# Patient Record
Sex: Male | Born: 1950 | Race: White | Hispanic: No | Smoking: Current every day smoker
Health system: Southern US, Community
[De-identification: ages and names within clinical notes are randomized; demographics above are authoritative.]

## PROBLEM LIST (undated history)

## (undated) DIAGNOSIS — I10 Essential (primary) hypertension: Secondary | ICD-10-CM

## (undated) DIAGNOSIS — I2510 Atherosclerotic heart disease of native coronary artery without angina pectoris: Secondary | ICD-10-CM

## (undated) HISTORY — PX: CORONARY ANGIOPLASTY WITH STENT PLACEMENT: SHX49

## (undated) HISTORY — PX: OTHER SURGICAL HISTORY: SHX169

---

## 2004-03-11 ENCOUNTER — Other Ambulatory Visit: Payer: Self-pay

## 2004-03-11 ENCOUNTER — Emergency Department: Payer: Self-pay | Admitting: General Practice

## 2004-03-12 ENCOUNTER — Encounter: Payer: Self-pay | Admitting: Internal Medicine

## 2004-04-22 ENCOUNTER — Ambulatory Visit: Payer: Self-pay | Admitting: Internal Medicine

## 2004-05-15 ENCOUNTER — Ambulatory Visit: Payer: Self-pay | Admitting: Internal Medicine

## 2004-07-18 ENCOUNTER — Ambulatory Visit: Payer: Self-pay | Admitting: Internal Medicine

## 2004-12-18 ENCOUNTER — Ambulatory Visit: Payer: Self-pay | Admitting: Internal Medicine

## 2005-05-24 ENCOUNTER — Ambulatory Visit: Payer: Self-pay | Admitting: Internal Medicine

## 2005-12-23 ENCOUNTER — Encounter: Payer: Self-pay | Admitting: Internal Medicine

## 2005-12-30 ENCOUNTER — Ambulatory Visit: Payer: Self-pay | Admitting: Internal Medicine

## 2006-04-29 ENCOUNTER — Ambulatory Visit: Payer: Self-pay | Admitting: Internal Medicine

## 2006-05-20 ENCOUNTER — Ambulatory Visit: Payer: Self-pay | Admitting: *Deleted

## 2006-07-21 ENCOUNTER — Ambulatory Visit: Payer: Self-pay | Admitting: Internal Medicine

## 2006-07-21 LAB — CONVERTED CEMR LAB
AST: 25 units/L (ref 0–37)
Bilirubin, Direct: 0.1 mg/dL (ref 0.0–0.3)
Chloride: 108 meq/L (ref 96–112)
Eosinophils Absolute: 0.1 10*3/uL (ref 0.0–0.6)
Eosinophils Relative: 1.7 % (ref 0.0–5.0)
GFR calc non Af Amer: 94 mL/min
Glucose, Bld: 93 mg/dL (ref 70–99)
HCT: 39.1 % (ref 39.0–52.0)
Hemoglobin: 13.7 g/dL (ref 13.0–17.0)
Lymphocytes Relative: 23.2 % (ref 12.0–46.0)
MCV: 90.9 fL (ref 78.0–100.0)
Monocytes Absolute: 0.6 10*3/uL (ref 0.2–0.7)
Neutrophils Relative %: 67.5 % (ref 43.0–77.0)
Potassium: 4.2 meq/L (ref 3.5–5.1)
RBC: 4.3 M/uL (ref 4.22–5.81)
Sodium: 143 meq/L (ref 135–145)
TSH: 0.28 microintl units/mL — ABNORMAL LOW (ref 0.35–5.50)
Total Bilirubin: 0.7 mg/dL (ref 0.3–1.2)
WBC: 8 10*3/uL (ref 4.5–10.5)

## 2006-07-24 ENCOUNTER — Ambulatory Visit: Payer: Self-pay | Admitting: *Deleted

## 2006-08-16 ENCOUNTER — Emergency Department: Payer: Self-pay | Admitting: Emergency Medicine

## 2006-08-16 ENCOUNTER — Other Ambulatory Visit: Payer: Self-pay

## 2006-10-14 ENCOUNTER — Ambulatory Visit: Payer: Self-pay | Admitting: Internal Medicine

## 2006-10-14 DIAGNOSIS — E785 Hyperlipidemia, unspecified: Secondary | ICD-10-CM | POA: Insufficient documentation

## 2006-10-14 DIAGNOSIS — F603 Borderline personality disorder: Secondary | ICD-10-CM | POA: Insufficient documentation

## 2006-10-15 LAB — CONVERTED CEMR LAB
Cholesterol: 144 mg/dL (ref 0–200)
Free T4: 0.7 ng/dL (ref 0.6–1.6)
LDL Cholesterol: 88 mg/dL (ref 0–99)
TSH: 0.36 microintl units/mL (ref 0.35–5.50)
VLDL: 16 mg/dL (ref 0–40)

## 2006-11-04 ENCOUNTER — Telehealth: Payer: Self-pay | Admitting: Internal Medicine

## 2007-01-28 DIAGNOSIS — E039 Hypothyroidism, unspecified: Secondary | ICD-10-CM | POA: Insufficient documentation

## 2007-01-28 DIAGNOSIS — I2510 Atherosclerotic heart disease of native coronary artery without angina pectoris: Secondary | ICD-10-CM

## 2007-02-01 ENCOUNTER — Telehealth: Payer: Self-pay | Admitting: Internal Medicine

## 2007-02-09 ENCOUNTER — Encounter (INDEPENDENT_AMBULATORY_CARE_PROVIDER_SITE_OTHER): Payer: Self-pay | Admitting: *Deleted

## 2007-11-04 ENCOUNTER — Ambulatory Visit: Payer: Self-pay | Admitting: Internal Medicine

## 2007-11-04 DIAGNOSIS — F438 Other reactions to severe stress: Secondary | ICD-10-CM

## 2007-11-04 DIAGNOSIS — R634 Abnormal weight loss: Secondary | ICD-10-CM

## 2007-11-08 LAB — CONVERTED CEMR LAB
ALT: 25 units/L (ref 0–53)
AST: 26 units/L (ref 0–37)
BUN: 13 mg/dL (ref 6–23)
Bilirubin, Direct: 0.1 mg/dL (ref 0.0–0.3)
Calcium: 9.1 mg/dL (ref 8.4–10.5)
Glucose, Bld: 84 mg/dL (ref 70–99)
Lymphocytes Relative: 20.3 % (ref 12.0–46.0)
Monocytes Relative: 3.4 % (ref 3.0–12.0)
Phosphorus: 3.8 mg/dL (ref 2.3–4.6)
Platelets: 218 10*3/uL (ref 150–400)
Potassium: 4.6 meq/L (ref 3.5–5.1)
RDW: 12.9 % (ref 11.5–14.6)
TSH: 0.37 microintl units/mL (ref 0.35–5.50)
Total Bilirubin: 0.5 mg/dL (ref 0.3–1.2)

## 2008-08-29 ENCOUNTER — Encounter: Payer: Self-pay | Admitting: Internal Medicine

## 2010-04-01 ENCOUNTER — Emergency Department: Payer: Self-pay | Admitting: Emergency Medicine

## 2010-11-27 ENCOUNTER — Emergency Department (HOSPITAL_COMMUNITY): Payer: No Typology Code available for payment source

## 2010-11-27 ENCOUNTER — Emergency Department (HOSPITAL_COMMUNITY)
Admission: EM | Admit: 2010-11-27 | Discharge: 2010-11-27 | Disposition: A | Discharge status: Home / Self Care | Payer: No Typology Code available for payment source | Attending: Emergency Medicine | Admitting: Emergency Medicine

## 2010-11-27 DIAGNOSIS — S20229A Contusion of unspecified back wall of thorax, initial encounter: Secondary | ICD-10-CM | POA: Insufficient documentation

## 2010-11-27 DIAGNOSIS — IMO0002 Reserved for concepts with insufficient information to code with codable children: Secondary | ICD-10-CM | POA: Insufficient documentation

## 2010-11-27 DIAGNOSIS — Z79899 Other long term (current) drug therapy: Secondary | ICD-10-CM | POA: Insufficient documentation

## 2010-11-27 DIAGNOSIS — Z8674 Personal history of sudden cardiac arrest: Secondary | ICD-10-CM | POA: Insufficient documentation

## 2010-11-27 DIAGNOSIS — M533 Sacrococcygeal disorders, not elsewhere classified: Secondary | ICD-10-CM | POA: Insufficient documentation

## 2010-11-27 DIAGNOSIS — I252 Old myocardial infarction: Secondary | ICD-10-CM | POA: Insufficient documentation

## 2010-11-27 DIAGNOSIS — E7800 Pure hypercholesterolemia, unspecified: Secondary | ICD-10-CM | POA: Insufficient documentation

## 2010-11-27 DIAGNOSIS — M79609 Pain in unspecified limb: Secondary | ICD-10-CM | POA: Insufficient documentation

## 2010-11-27 DIAGNOSIS — M542 Cervicalgia: Secondary | ICD-10-CM | POA: Insufficient documentation

## 2010-11-27 DIAGNOSIS — S6000XA Contusion of unspecified finger without damage to nail, initial encounter: Secondary | ICD-10-CM | POA: Insufficient documentation

## 2010-11-27 DIAGNOSIS — S139XXA Sprain of joints and ligaments of unspecified parts of neck, initial encounter: Secondary | ICD-10-CM | POA: Insufficient documentation

## 2012-03-08 DIAGNOSIS — I2510 Atherosclerotic heart disease of native coronary artery without angina pectoris: Secondary | ICD-10-CM | POA: Insufficient documentation

## 2013-05-23 ENCOUNTER — Ambulatory Visit: Payer: PRIVATE HEALTH INSURANCE | Admitting: Internal Medicine

## 2013-05-23 DIAGNOSIS — Z0289 Encounter for other administrative examinations: Secondary | ICD-10-CM

## 2013-06-12 ENCOUNTER — Emergency Department (HOSPITAL_COMMUNITY): Payer: PRIVATE HEALTH INSURANCE

## 2013-06-12 ENCOUNTER — Encounter (HOSPITAL_COMMUNITY): Payer: Self-pay | Admitting: Emergency Medicine

## 2013-06-12 ENCOUNTER — Other Ambulatory Visit: Payer: Self-pay

## 2013-06-12 ENCOUNTER — Emergency Department (HOSPITAL_COMMUNITY)
Admission: EM | Admit: 2013-06-12 | Discharge: 2013-06-12 | Disposition: A | Discharge status: Home / Self Care | Payer: PRIVATE HEALTH INSURANCE | Attending: Emergency Medicine | Admitting: Emergency Medicine

## 2013-06-12 DIAGNOSIS — M545 Low back pain, unspecified: Secondary | ICD-10-CM | POA: Insufficient documentation

## 2013-06-12 DIAGNOSIS — R079 Chest pain, unspecified: Secondary | ICD-10-CM

## 2013-06-12 DIAGNOSIS — I10 Essential (primary) hypertension: Secondary | ICD-10-CM | POA: Insufficient documentation

## 2013-06-12 DIAGNOSIS — M549 Dorsalgia, unspecified: Secondary | ICD-10-CM

## 2013-06-12 DIAGNOSIS — I2510 Atherosclerotic heart disease of native coronary artery without angina pectoris: Secondary | ICD-10-CM | POA: Insufficient documentation

## 2013-06-12 DIAGNOSIS — F17200 Nicotine dependence, unspecified, uncomplicated: Secondary | ICD-10-CM | POA: Insufficient documentation

## 2013-06-12 DIAGNOSIS — R0789 Other chest pain: Secondary | ICD-10-CM | POA: Insufficient documentation

## 2013-06-12 HISTORY — DX: Atherosclerotic heart disease of native coronary artery without angina pectoris: I25.10

## 2013-06-12 HISTORY — DX: Essential (primary) hypertension: I10

## 2013-06-12 LAB — CBC WITH DIFFERENTIAL/PLATELET
BASOS ABS: 0 10*3/uL (ref 0.0–0.1)
BASOS PCT: 0 % (ref 0–1)
EOS ABS: 0.2 10*3/uL (ref 0.0–0.7)
Eosinophils Relative: 2 % (ref 0–5)
HCT: 42.3 % (ref 39.0–52.0)
HEMOGLOBIN: 14.5 g/dL (ref 13.0–17.0)
Lymphocytes Relative: 24 % (ref 12–46)
Lymphs Abs: 1.8 10*3/uL (ref 0.7–4.0)
MCH: 31.4 pg (ref 26.0–34.0)
MCHC: 34.3 g/dL (ref 30.0–36.0)
MCV: 91.6 fL (ref 78.0–100.0)
MONO ABS: 0.7 10*3/uL (ref 0.1–1.0)
MONOS PCT: 9 % (ref 3–12)
NEUTROS ABS: 4.8 10*3/uL (ref 1.7–7.7)
NEUTROS PCT: 64 % (ref 43–77)
Platelets: 176 10*3/uL (ref 150–400)
RBC: 4.62 MIL/uL (ref 4.22–5.81)
RDW: 12.2 % (ref 11.5–15.5)
WBC: 7.4 10*3/uL (ref 4.0–10.5)

## 2013-06-12 LAB — BASIC METABOLIC PANEL
BUN: 16 mg/dL (ref 6–23)
CO2: 29 mEq/L (ref 19–32)
CREATININE: 0.95 mg/dL (ref 0.50–1.35)
Calcium: 9.2 mg/dL (ref 8.4–10.5)
Chloride: 105 mEq/L (ref 96–112)
GFR, EST NON AFRICAN AMERICAN: 89 mL/min — AB (ref >90)
Glucose, Bld: 99 mg/dL (ref 70–99)
POTASSIUM: 4.5 meq/L (ref 3.7–5.3)
Sodium: 142 mEq/L (ref 137–147)

## 2013-06-12 LAB — TROPONIN I: Troponin I: <0.3 ng/mL (ref <0.30)

## 2013-06-12 LAB — URINALYSIS, ROUTINE W REFLEX MICROSCOPIC
Bilirubin Urine: NEGATIVE
GLUCOSE, UA: NEGATIVE mg/dL
KETONES UR: NEGATIVE mg/dL
Leukocytes, UA: NEGATIVE
Nitrite: NEGATIVE
PROTEIN: NEGATIVE mg/dL
Specific Gravity, Urine: >1.03 — ABNORMAL HIGH (ref 1.005–1.030)
UROBILINOGEN UA: 0.2 mg/dL (ref 0.0–1.0)
pH: 6 (ref 5.0–8.0)

## 2013-06-12 LAB — URINE MICROSCOPIC-ADD ON

## 2013-06-12 LAB — PRO B NATRIURETIC PEPTIDE: PRO B NATRI PEPTIDE: 22.7 pg/mL (ref 0–125)

## 2013-06-12 MED ORDER — HYDROCODONE-ACETAMINOPHEN 5-325 MG PO TABS: 2.0000 | ORAL_TABLET | ORAL | Status: DC | PRN

## 2013-06-12 NOTE — ED Provider Notes (Signed)
CSN: [id number]     Arrival date & time 06/12/13  1008 History  This chart was scribed for Gilda Crease, MD by Bennett Scrape, ED Scribe. This patient was seen in room APA05/APA05 and the patient's care was started at 10:24 AM.   Chief Complaint  Patient presents with  . Chest Pain    The history is provided by the patient. No language interpreter was used.    HPI Comments: Louis Carr is a 63 y.o. male who presents to the Emergency Department in the [place] of [facility] Department complaining of left lower back pain that started one month. He describes the pain as dull and aching. He states that most of the time the pain is constant but occasionally it will be intermittent. He denies any changes in BMs or urinary symptoms.   He has a secondary complaint of upper left-sided CP that has been going on for one month or longer. The pain is intermittent and brief, lasting a few seconds at a time. He describes this pain as more sharp. He denies that the pain is triggered by exertion. He denies having any prolonged episodes of pain. He denies any cough or fevers. He has a h/o cardiac stent placement in 2005 done at [facility].   Past Medical History  Diagnosis Date  . Coronary artery disease   . Hypertension    Past Surgical History  Procedure Laterality Date  . Stint     No family history on file. History  Substance Use Topics  . Smoking status: Current Every Day Smoker  . Smokeless tobacco: Not on file  . Alcohol Use: Yes    Review of Systems  Cardiovascular: Positive for chest pain.  Gastrointestinal: Negative for nausea, vomiting and diarrhea.  Genitourinary: Negative for hematuria and difficulty urinating.  Musculoskeletal: Positive for back pain.  All other systems reviewed and are negative.    Allergies  Review of patient's allergies indicates no known allergies.  Home Medications  No current outpatient prescriptions on file.  Triage Vitals: BP  109/75  Pulse 50  Temp(Src) 98.3 F (36.8 C) (Oral)  Resp 12  Ht 5\' 11"  (1.803 m)  Wt 160 lb (72.576 kg)  BMI 22.33 kg/m2  SpO2 100%  Physical Exam  Nursing note and vitals reviewed. Constitutional: He is oriented to person, place, and time. He appears well-developed and well-nourished. No distress.  HENT:  Head: Normocephalic and atraumatic.  Right Ear: Hearing normal.  Left Ear: Hearing normal.  Nose: Nose normal.  Mouth/Throat: Oropharynx is clear and moist and mucous membranes are normal.  Eyes: Conjunctivae and EOM are normal. Pupils are equal, round, and reactive to light.  Neck: Normal range of motion. Neck supple.  Cardiovascular: Normal rate, regular rhythm, S1 normal and S2 normal.  Exam reveals no gallop and no friction rub.   No murmur heard. Pulmonary/Chest: Effort normal and breath sounds normal. No respiratory distress. He exhibits no tenderness.  Abdominal: Soft. Normal appearance and bowel sounds are normal. There is no hepatosplenomegaly. There is no tenderness. There is no rebound, no guarding, no tenderness at McBurney's point and negative Murphy's sign. No hernia.  Musculoskeletal: Normal range of motion.  Neurological: He is alert and oriented to person, place, and time. He has normal strength. No cranial nerve deficit or sensory deficit. Coordination normal. GCS eye subscore is 4. GCS verbal subscore is 5. GCS motor subscore is 6.  Skin: Skin is warm, dry and intact. No rash noted. No cyanosis.  Psychiatric:  He has a normal mood and affect. His speech is normal and behavior is normal. Thought content normal.    ED Course  Procedures (including critical care time)  DIAGNOSTIC STUDIES: Oxygen Saturation is 100% on RA, normal by my interpretation.    COORDINATION OF CARE: 10:23 AM-Discussed treatment plan which includes  (CXR, CBC panel, CMP, UA) with pt at bedside and pt agreed to plan.   Labs Review Labs Reviewed - No data to display Imaging Review No  results found.  EKG Interpretation   None       Date: 06/12/2013  Rate: 49  Rhythm: sinus tachycardia and sinus bradycardia  QRS Axis: normal  Intervals: normal  ST/T Wave abnormalities: early repolarization  Conduction Disutrbances:none  Narrative Interpretation:   Old EKG Reviewed: none available    MDM   1. Chest pain   2. Back pain    ER for evaluation of 2 separate complaints. Patient has been having intermittent left-sided chest pain for at least a month. Pain is sharp and only lasts for seconds. He is not identified anything that brings it on, it is not related to exertion. He does have a cardiac history, having stents placed at [facility]. At this point, this does not appear to be related to cardiac history. His EKG does not show any obvious acute ischemia or infarct. His troponin was negative. Patient's symptoms have been going on for more than a month and are very atypical. He is to be referred back to his [facility]ologist at [facility] for repeat evaluation.  Also complaining of pain in the left lower back region. He has no neurologic findings on examination.  A chest x-ray was obtained today and he does have compression fractures at T8 and T9. These compression fractures might explain his pain in its entirety, including the chest and the back. They appear chronic and do not require any intervention at this time. The patient be treated with analgesia, referred back to his primary care doctor for further monitoring.  I personally performed the services described in this documentation, which was scribed in my presence. The recorded information has been reviewed and is accurate.     Gilda Creasehristopher J. Pollina, MD 06/12/13 [phone]

## 2013-06-12 NOTE — ED Notes (Signed)
Complain of back and chest pain for a while.

## 2013-06-12 NOTE — Discharge Instructions (Signed)
Back Pain, Adult  Low back pain is very common. About 1 in 5 people have back pain.The cause of low back pain is rarely dangerous. The pain often gets better over time.About half of people with a sudden onset of back pain feel better in just 2 weeks. About 8 in 10 people feel better by 6 weeks.   CAUSES  Some common causes of back pain include:   Strain of the muscles or ligaments supporting the spine.   Wear and tear (degeneration) of the spinal discs.   Arthritis.   Direct injury to the back.  DIAGNOSIS  Most of the time, the direct cause of low back pain is not known.However, back pain can be treated effectively even when the exact cause of the pain is unknown.Answering your caregiver's questions about your overall health and symptoms is one of the most accurate ways to make sure the cause of your pain is not dangerous. If your caregiver needs more information, he or she may order lab work or imaging tests (X-rays or MRIs).However, even if imaging tests show changes in your back, this usually does not require surgery.  HOME CARE INSTRUCTIONS  For many people, back pain returns.Since low back pain is rarely dangerous, it is often a condition that people can learn to manageon their own.    Remain active. It is stressful on the back to sit or stand in one place. Do not sit, drive, or stand in one place for more than 30 minutes at a time. Take short walks on level surfaces as soon as pain allows.Try to increase the length of time you walk each day.   Do not stay in bed.Resting more than 1 or 2 days can delay your recovery.   Do not avoid exercise or work.Your body is made to move.It is not dangerous to be active, even though your back may hurt.Your back will likely heal faster if you return to being active before your pain is gone.   Pay attention to your body when you bend and lift. Many people have less discomfortwhen lifting if they bend their knees, keep the load close to their bodies,and  avoid twisting. Often, the most comfortable positions are those that put less stress on your recovering back.   Find a comfortable position to sleep. Use a firm mattress and lie on your side with your knees slightly bent. If you lie on your back, put a pillow under your knees.   Only take over-the-counter or prescription medicines as directed by your caregiver. Over-the-counter medicines to reduce pain and inflammation are often the most helpful.Your caregiver may prescribe muscle relaxant drugs.These medicines help dull your pain so you can more quickly return to your normal activities and healthy exercise.   Put ice on the injured area.   Put ice in a plastic bag.   Place a towel between your skin and the bag.   Leave the ice on for 15-20 minutes, 03-04 times a day for the first 2 to 3 days. After that, ice and heat may be alternated to reduce pain and spasms.   Ask your caregiver about trying back exercises and gentle massage. This may be of some benefit.   Avoid feeling anxious or stressed.Stress increases muscle tension and can worsen back pain.It is important to recognize when you are anxious or stressed and learn ways to manage it.Exercise is a great option.  SEEK MEDICAL CARE IF:   You have pain that is not relieved with rest or   medicine.   You have pain that does not improve in 1 week.   You have new symptoms.   You are generally not feeling well.  SEEK IMMEDIATE MEDICAL CARE IF:    You have pain that radiates from your back into your legs.   You develop new bowel or bladder control problems.   You have unusual weakness or numbness in your arms or legs.   You develop nausea or vomiting.   You develop abdominal pain.   You feel faint.  Document Released: 05/26/2005 Document Revised: 11/25/2011 Document Reviewed: 10/14/2010  ExitCare Patient Information 2014 [employer]  Chest Pain (Nonspecific)  It is often hard to give a specific diagnosis for the cause of chest pain. There is  always a chance that your pain could be related to something serious, such as a heart attack or a blood clot in the lungs. You need to follow up with your caregiver for further evaluation.  CAUSES    Heartburn.   Pneumonia or bronchitis.   Anxiety or stress.   Inflammation around your heart (pericarditis) or lung (pleuritis or pleurisy).   A blood clot in the lung.   A collapsed lung (pneumothorax). It can develop suddenly on its own (spontaneous pneumothorax) or from injury (trauma) to the chest.   Shingles infection (herpes zoster virus).  The chest wall is composed of bones, muscles, and cartilage. Any of these can be the source of the pain.   The bones can be bruised by injury.   The muscles or cartilage can be strained by coughing or overwork.   The cartilage can be affected by inflammation and become sore (costochondritis).  DIAGNOSIS   Lab tests or other studies, such as X-rays, electrocardiography, stress testing, or cardiac imaging, may be needed to find the cause of your pain.   TREATMENT    Treatment depends on what may be causing your chest pain. Treatment may include:   Acid blockers for heartburn.   Anti-inflammatory medicine.   Pain medicine for inflammatory conditions.   Antibiotics if an infection is present.   You may be advised to change lifestyle habits. This includes stopping smoking and avoiding alcohol, caffeine, and chocolate.   You may be advised to keep your head raised (elevated) when sleeping. This reduces the chance of acid going backward from your stomach into your esophagus.   Most of the time, nonspecific chest pain will improve within 2 to 3 days with rest and mild pain medicine.  HOME CARE INSTRUCTIONS    If antibiotics were prescribed, take your antibiotics as directed. Finish them even if you start to feel better.   For the next few days, avoid physical activities that bring on chest pain. Continue physical activities as directed.   Do not smoke.   Avoid  drinking alcohol.   Only take over-the-counter or prescription medicine for pain, discomfort, or fever as directed by your caregiver.   Follow your caregiver's suggestions for further testing if your chest pain does not go away.   Keep any follow-up appointments you made. If you do not go to an appointment, you could develop lasting (chronic) problems with pain. If there is any problem keeping an appointment, you must call to reschedule.  SEEK MEDICAL CARE IF:    You think you are having problems from the medicine you are taking. Read your medicine instructions carefully.   Your chest pain does not go away, even after treatment.   You develop a rash with blisters on   your chest.  SEEK IMMEDIATE MEDICAL CARE IF:    You have increased chest pain or pain that spreads to your arm, neck, jaw, back, or abdomen.   You develop shortness of breath, an increasing cough, or you are coughing up blood.   You have severe back or abdominal pain, feel nauseous, or vomit.   You develop severe weakness, fainting, or chills.   You have a fever.  THIS IS AN EMERGENCY. Do not wait to see if the pain will go away. Get medical help at once. Call your local emergency services (911 in [place] Do not drive yourself to the hospital.  MAKE SURE YOU:    Understand these instructions.   Will watch your condition.   Will get help right away if you are not doing well or get worse.  Document Released: 03/05/2005 Document Revised: 08/18/2011 Document Reviewed: 12/30/2007  ExitCare Patient Information 2014 [employer].

## 2013-06-22 ENCOUNTER — Ambulatory Visit: Payer: PRIVATE HEALTH INSURANCE | Admitting: Internal Medicine

## 2018-06-21 ENCOUNTER — Other Ambulatory Visit: Payer: Self-pay

## 2018-06-21 ENCOUNTER — Emergency Department
Admission: EM | Admit: 2018-06-21 | Discharge: 2018-06-21 | Disposition: A | Discharge status: Home / Self Care | Payer: Worker's Compensation | Attending: Emergency Medicine | Admitting: Emergency Medicine

## 2018-06-21 ENCOUNTER — Emergency Department: Payer: Worker's Compensation

## 2018-06-21 DIAGNOSIS — S20211A Contusion of right front wall of thorax, initial encounter: Secondary | ICD-10-CM | POA: Insufficient documentation

## 2018-06-21 DIAGNOSIS — I2510 Atherosclerotic heart disease of native coronary artery without angina pectoris: Secondary | ICD-10-CM | POA: Insufficient documentation

## 2018-06-21 DIAGNOSIS — W231XXA Caught, crushed, jammed, or pinched between stationary objects, initial encounter: Secondary | ICD-10-CM | POA: Diagnosis not present

## 2018-06-21 DIAGNOSIS — Y999 Unspecified external cause status: Secondary | ICD-10-CM | POA: Insufficient documentation

## 2018-06-21 DIAGNOSIS — F17200 Nicotine dependence, unspecified, uncomplicated: Secondary | ICD-10-CM | POA: Diagnosis not present

## 2018-06-21 DIAGNOSIS — Z7982 Long term (current) use of aspirin: Secondary | ICD-10-CM | POA: Insufficient documentation

## 2018-06-21 DIAGNOSIS — Y939 Activity, unspecified: Secondary | ICD-10-CM | POA: Insufficient documentation

## 2018-06-21 DIAGNOSIS — I10 Essential (primary) hypertension: Secondary | ICD-10-CM | POA: Insufficient documentation

## 2018-06-21 DIAGNOSIS — Y990 Civilian activity done for income or pay: Secondary | ICD-10-CM | POA: Insufficient documentation

## 2018-06-21 DIAGNOSIS — S299XXA Unspecified injury of thorax, initial encounter: Secondary | ICD-10-CM | POA: Diagnosis present

## 2018-06-21 DIAGNOSIS — Z7902 Long term (current) use of antithrombotics/antiplatelets: Secondary | ICD-10-CM | POA: Insufficient documentation

## 2018-06-21 DIAGNOSIS — E039 Hypothyroidism, unspecified: Secondary | ICD-10-CM | POA: Insufficient documentation

## 2018-06-21 DIAGNOSIS — Y929 Unspecified place or not applicable: Secondary | ICD-10-CM | POA: Insufficient documentation

## 2018-06-21 DIAGNOSIS — Z79899 Other long term (current) drug therapy: Secondary | ICD-10-CM | POA: Insufficient documentation

## 2018-06-21 MED ORDER — OXYCODONE-ACETAMINOPHEN 7.5-325 MG PO TABS: 1.0000 | ORAL_TABLET | Freq: Four times a day (QID) | ORAL | 0 refills | Status: DC | PRN

## 2018-06-21 MED ORDER — CYCLOBENZAPRINE HCL 10 MG PO TABS: 10.0000 mg | ORAL_TABLET | Freq: Three times a day (TID) | ORAL | 0 refills | Status: DC | PRN

## 2018-06-21 MED ORDER — IBUPROFEN 600 MG PO TABS: 600.0000 mg | ORAL_TABLET | Freq: Three times a day (TID) | ORAL | 0 refills | Status: DC | PRN

## 2018-06-21 MED ORDER — KETOROLAC TROMETHAMINE 60 MG/2ML IM SOLN
30.0000 mg | Freq: Once | INTRAMUSCULAR | Status: AC
  Administered 2018-06-21: 30 mg via INTRAMUSCULAR
  Filled 2018-06-21: qty 2

## 2018-06-21 NOTE — ED Provider Notes (Signed)
[facility] Emergency Department Provider Note   ____________________________________________   First MD Initiated Contact with Patient 06/21/18 1036     (approximate)  I have reviewed the triage vital signs and the nursing notes.   HISTORY  Chief Complaint Shortness of Breath and Trauma    HPI Louis Carr is a 68 y.o. male patient complain of right lateral chest wall pain secondary to contusion.  Patient state he was pinned between a door and steel grinder at work.  3 days ago.  Patient states the pain has increased from his soreness to acute pain with deep inspirations.  Patient also state pain increases with abduction of the right upper extremity.  Past Medical History:  Diagnosis Date  . Coronary artery disease   . Hypertension     Patient Active Problem List   Diagnosis Date Noted  . ANXIETY, SITUATIONAL 11/04/2007  . WEIGHT LOSS, ABNORMAL 11/04/2007  . HYPOTHYROIDISM 01/28/2007  . CORONARY ARTERY DISEASE 01/28/2007  . HYPERLIPIDEMIA 10/14/2006  . 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 stint      Prior to Admission medications   Medication Sig Start Date End Date Taking? Authorizing Provider  aspirin EC 81 MG tablet Take 81 mg by mouth daily.    [provider]  clopidogrel (PLAVIX) 75 MG tablet Take 75 mg by mouth daily with breakfast.    [provider]  cyclobenzaprine (FLEXERIL) 10 MG tablet Take 1 tablet (10 mg total) by mouth 3 (three) times daily as needed. 06/21/18   Joni ReiningSmith, Avangeline Stockburger K, PA-C  HYDROcodone-acetaminophen (NORCO/VICODIN) 5-325 MG per tablet Take 2 tablets by mouth every 4 (four) hours as needed for moderate pain. 06/12/13   Gilda CreasePollina, Christopher J, MD  ibuprofen (ADVIL,MOTRIN) 600 MG tablet Take 1 tablet (600 mg total) by mouth every 8 (eight) hours as needed. 06/21/18   Joni ReiningSmith, Ewell Benassi K, PA-C  lisinopril (PRINIVIL,ZESTRIL) 5 MG tablet Take 5  mg by mouth daily.    [provider]  oxyCODONE-acetaminophen (PERCOCET) 7.5-325 MG tablet Take 1 tablet by mouth every 6 (six) hours as needed for severe pain. 06/21/18   Joni ReiningSmith, Camauri Craton K, PA-C    Allergies Patient has no known allergies.  No family history on file.  Social History Social History   Tobacco Use  . Smoking status: Current Every Day Smoker  . Smokeless tobacco: Never Used  Substance Use Topics  . Alcohol use: Yes  . Drug use: No    Review of Systems Constitutional: No fever/chills Eyes: No visual changes. ENT: No sore throat. Cardiovascular: Denies chest pain. Respiratory: Denies shortness of breath. Gastrointestinal: No abdominal pain.  No nausea, no vomiting.  No diarrhea.  No constipation. Musculoskeletal: Right chest wall pain. Skin: Negative for rash. Neurological: Negative for headaches, focal weakness or numbness. Psychiatric:Anxiety. Endocrine:Hyperlipidemia, hypothyroidism.  Hypertension.  ____________________________________________   PHYSICAL EXAM:  VITAL SIGNS: ED Triage Vitals  Enc Vitals Group     BP 06/21/18 0927 (!) 140/94     Pulse Rate 06/21/18 0927 69     Resp 06/21/18 0927 17     Temp 06/21/18 0927 98.3 F (36.8 C)     Temp Source 06/21/18 0927 Oral     SpO2 06/21/18 0927 98 %     Weight 06/21/18 0928 190 lb (86.2 kg)     Height 06/21/18 0928 5\' 11"  (1.803 m)     Head Circumference --      Peak  Flow --      Pain Score 06/21/18 0928 7     Pain Loc --      Pain Edu? --      Excl. in GC? --     Constitutional: Alert and oriented. Well appearing and in no acute distress. Neck: No stridor.   Hematological/Lymphatic/Immunilogical: No cervical lymphadenopathy. Cardiovascular: Normal rate, regular rhythm. Grossly normal heart sounds.  Good peripheral circulation. Respiratory: Right-sided splinting with inspiration.   No retractions. Lungs CTAB. Gastrointestinal: Soft and nontender. No distention. No abdominal bruits.  No CVA tenderness. Musculoskeletal: No obvious deformity to the left leg.  Patient has moderate guarding palpation of the left calf. Neurologic:  Normal speech and language. No gross focal neurologic deficits are appreciated. No gait instability. Skin:  Skin is warm, dry and intact. No rash noted.  No abrasion or ecchymosis. Psychiatric: Mood and affect are normal. Speech and behavior are normal.  ____________________________________________   LABS (all labs ordered are listed, but only abnormal results are displayed)  Labs Reviewed - No data to display ____________________________________________  EKG   ____________________________________________  RADIOLOGY  ED MD interpretation:    Official radiology report(s): Dg Chest 2 View  Result Date: 06/21/2018 CLINICAL DATA:  Chest pain after injury at work 3 days ago. EXAM: CHEST - 2 VIEW COMPARISON:  None available currently. FINDINGS: The heart size and mediastinal contours are within normal limits. Both lungs are clear. No pneumothorax or pleural effusion is noted. Compression deformity of 2 lower thoracic vertebral bodies is noted most consistent with old fractures. IMPRESSION: No active cardiopulmonary disease. Electronically Signed   By: Lupita Raider, M.D.   On: 06/21/2018 09:57    ____________________________________________   PROCEDURES  Procedure(s) performed: None  Procedures  Critical Care performed: No  ____________________________________________   INITIAL IMPRESSION / ASSESSMENT AND PLAN / ED COURSE  As part of my medical decision making, I reviewed the following data within the electronic MEDICAL RECORD NUMBER    Right chest wall pain secondary to contusion.  Discussed negative x-ray findings with patient.  Patient given discharge care instruction advised take medication as directed.  Patient advised return to ED if condition worsens.      ____________________________________________   FINAL CLINICAL  IMPRESSION(S) / ED DIAGNOSES  Final diagnoses:  Rib contusion, right, initial encounter     ED Discharge Orders         Ordered    oxyCODONE-acetaminophen (PERCOCET) 7.5-325 MG tablet  Every 6 hours PRN     06/21/18 1048    cyclobenzaprine (FLEXERIL) 10 MG tablet  3 times daily PRN     06/21/18 1048    ibuprofen (ADVIL,MOTRIN) 600 MG tablet  Every 8 hours PRN     06/21/18 1048           Note:  This document was prepared using Dragon voice recognition software and may include unintentional dictation errors.    Joni Reining, PA-C 06/21/18 1056    Minna Antis, MD 06/21/18 [phone]

## 2018-06-21 NOTE — ED Notes (Signed)
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  Phone No. [phone] Angie Lemmons - WC.

## 2018-06-21 NOTE — Discharge Instructions (Signed)
Follow discharge care instructions and denied take Flexeril and Percocet while working and driving.

## 2018-06-21 NOTE — ED Notes (Signed)
See triage note  Presents with some chest discomfort  States he had a machine drop on to chest on Friday   States he was having some soreness to chest   But today pain has increased

## 2018-06-21 NOTE — ED Triage Notes (Addendum)
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 States he seemed ok until today having right sided chest tenderness/pain and SOB. Denies any recent illness. Pt is in NAD, respirations WNL, skin  Is warm and dry

## 2018-07-12 ENCOUNTER — Other Ambulatory Visit: Payer: Self-pay | Admitting: Physician Assistant

## 2018-07-12 DIAGNOSIS — S32020A Wedge compression fracture of second lumbar vertebra, initial encounter for closed fracture: Secondary | ICD-10-CM

## 2018-07-12 DIAGNOSIS — S22070A Wedge compression fracture of T9-T10 vertebra, initial encounter for closed fracture: Secondary | ICD-10-CM

## 2018-07-12 DIAGNOSIS — Z77018 Contact with and (suspected) exposure to other hazardous metals: Secondary | ICD-10-CM

## 2018-07-19 ENCOUNTER — Ambulatory Visit
Admission: RE | Admit: 2018-07-19 | Discharge: 2018-07-19 | Disposition: A | Discharge status: Home / Self Care | Payer: Worker's Compensation | Source: Ambulatory Visit | Attending: Physician Assistant | Admitting: Physician Assistant

## 2018-07-19 DIAGNOSIS — S32020A Wedge compression fracture of second lumbar vertebra, initial encounter for closed fracture: Secondary | ICD-10-CM

## 2018-07-19 DIAGNOSIS — S22070A Wedge compression fracture of T9-T10 vertebra, initial encounter for closed fracture: Secondary | ICD-10-CM

## 2018-07-19 DIAGNOSIS — Z77018 Contact with and (suspected) exposure to other hazardous metals: Secondary | ICD-10-CM

## 2018-09-08 ENCOUNTER — Ambulatory Visit: Payer: Self-pay | Admitting: Internal Medicine

## 2018-10-15 ENCOUNTER — Telehealth: Payer: Self-pay

## 2018-10-15 NOTE — Telephone Encounter (Signed)
Copied from CRM [id number]. Topic: Appointment Scheduling - Scheduling Inquiry for Clinic >> Oct 14, 2018  3:28 PM Fanny Bien wrote: Reason for CRM: pt called and stated that he would like a call back about rescheduling new patient appointment. Unable to reach practice. Please advise  pt has appt scheduled. Myrella Fahs,cma

## 2018-11-03 ENCOUNTER — Ambulatory Visit: Payer: Self-pay | Admitting: Internal Medicine

## 2018-11-30 ENCOUNTER — Ambulatory Visit: Payer: PRIVATE HEALTH INSURANCE | Admitting: Internal Medicine

## 2018-12-01 ENCOUNTER — Other Ambulatory Visit: Payer: Self-pay

## 2018-12-01 ENCOUNTER — Ambulatory Visit (INDEPENDENT_AMBULATORY_CARE_PROVIDER_SITE_OTHER): Payer: Worker's Compensation | Admitting: Internal Medicine

## 2018-12-01 ENCOUNTER — Encounter: Payer: Self-pay | Admitting: Internal Medicine

## 2018-12-01 DIAGNOSIS — Z125 Encounter for screening for malignant neoplasm of prostate: Secondary | ICD-10-CM

## 2018-12-01 DIAGNOSIS — E039 Hypothyroidism, unspecified: Secondary | ICD-10-CM

## 2018-12-01 DIAGNOSIS — I2510 Atherosclerotic heart disease of native coronary artery without angina pectoris: Secondary | ICD-10-CM

## 2018-12-01 DIAGNOSIS — Z1159 Encounter for screening for other viral diseases: Secondary | ICD-10-CM

## 2018-12-01 DIAGNOSIS — E559 Vitamin D deficiency, unspecified: Secondary | ICD-10-CM

## 2018-12-01 DIAGNOSIS — S22000D Wedge compression fracture of unspecified thoracic vertebra, subsequent encounter for fracture with routine healing: Secondary | ICD-10-CM

## 2018-12-01 DIAGNOSIS — Z1389 Encounter for screening for other disorder: Secondary | ICD-10-CM

## 2018-12-01 DIAGNOSIS — Z13818 Encounter for screening for other digestive system disorders: Secondary | ICD-10-CM

## 2018-12-01 DIAGNOSIS — M79605 Pain in left leg: Secondary | ICD-10-CM | POA: Insufficient documentation

## 2018-12-01 DIAGNOSIS — Z13220 Encounter for screening for lipoid disorders: Secondary | ICD-10-CM

## 2018-12-01 DIAGNOSIS — G8929 Other chronic pain: Secondary | ICD-10-CM | POA: Insufficient documentation

## 2018-12-01 DIAGNOSIS — M79652 Pain in left thigh: Secondary | ICD-10-CM

## 2018-12-01 DIAGNOSIS — M25552 Pain in left hip: Secondary | ICD-10-CM | POA: Insufficient documentation

## 2018-12-01 NOTE — Patient Instructions (Signed)
Spinal Compression Fracture  A spinal compression fracture is a collapse of the bones that form the spine (vertebrae). With this type of fracture, the vertebrae become pushed (compressed) into a wedge shape. Most compression fractures happen in the middle or lower part of the spine. What are the causes? This condition may be caused by:  Thinning and loss of density in the bones (osteoporosis). This is the most common cause.  A fall.  A car or motorcycle accident.  Cancer.  Trauma, such as a heavy, direct hit to the head or back. What increases the risk? You are more likely to develop this condition if:  You are 62 years or older.  You have osteoporosis.  You have certain types of cancer, including: ? Multiple myeloma. ? Lymphoma. ? Prostate cancer. ? Lung cancer. ? Breast cancer. What are the signs or symptoms? Symptoms of this condition include:  Severe pain.  Pain that gets worse over time.  Pain that is worse when you stand, walk, sit, or bend.  Sudden pain that is so bad that it is hard for you to move.  Bending or humping of the spine.  Gradual loss of height.  Numbness, tingling, or weakness in the back and legs.  Trouble walking. Your symptoms will depend on the cause of the fracture and how quickly it develops. How is this diagnosed? This condition may be diagnosed based on symptoms, medical history, and a physical exam. During the physical exam, your health care provider may tap along the length of your spine to check for tenderness. Tests may be done to confirm the diagnosis. They may include:  A bone mineral density test to check for osteoporosis.  Imaging tests, such as a spine X-ray, CT scan, or MRI. How is this treated? Treatment for this condition depends on the cause and severity of the condition. Some fractures may heal on their own with supportive care. Treatment may include:  Pain medicine.  Rest.  A back brace.  Physical therapy  exercises.  Medicine to strengthen bone.  Calcium and vitamin D supplements. Fractures that cause the back to become misshapen, cause nerve pain or weakness, or do not respond to other treatment may be treated with surgery. This may include:  Vertebroplasty. Bone cement is injected into the collapsed vertebrae to stabilize them.  Balloon kyphoplasty. The collapsed vertebrae are expanded with a balloon and then bone cement is injected into them.  Spinal fusion. The collapsed vertebrae are connected (fused) to normal vertebrae. Follow these instructions at home: Medicines  Take over-the-counter and prescription medicines only as told by your health care provider.  Do not drive or operate heavy machinery while taking prescription pain medicine.  If you are taking prescription pain medicine, take actions to prevent or treat constipation. Your health care provider may recommend that you: ? Drink enough fluid to keep your urine pale yellow. ? Eat foods that are high in fiber, such as fresh fruits and vegetables, whole grains, and beans. ? Limit foods that are high in fat and processed sugars, such as fried or sweet foods. ? Take an over-the-counter or prescription medicine for constipation. If you have a brace:  Wear the brace as told by your health care provider. Remove it only as told by your health care provider.  Loosen the brace if your fingers or toes tingle, become numb, or turn cold and blue.  Keep the brace clean.  If the brace is not waterproof: ? Do not let it get wet. ?  Wear the brace as told by your health care provider. Remove it only as told by your health care provider.   Loosen the brace if your fingers or toes tingle, become numb, or turn cold and blue.   Keep the brace clean.   If the brace is not waterproof:  ? Do not let it get wet.  ? Cover it with a watertight covering when you take a bath or a shower.  Managing pain, stiffness, and swelling     If directed, apply ice to the injured area:  ? If you have a removable brace, remove it as told by your health care provider.  ? Put ice in a plastic bag.  ? Place a towel between your skin and the bag.  ? Leave the ice on for 30 minutes every two hours at first. Then apply the ice as  needed.  Activity   Rest as told by your health care provider.  ? Avoid sitting for a long time without moving. Get up to take short walks every 1-2 hours. This is important to improve blood flow and breathing. Ask for help if you feel weak or unsteady.   Return to your normal activities as directed by your health care provider. Ask what activities are safe for you.   Do exercises to improve motion and strength in your back (physical therapy), as recommended by your health care provider.   Exercise regularly as directed by your health care provider.  General instructions     Do not drink alcohol. Alcohol can interfere with your treatment.   Do not use any products that contain nicotine or tobacco, such as cigarettes and e-cigarettes. These can delay bone healing. If you need help quitting, ask your health care provider.   Keep all follow-up visits as told by your health care provider. This is important. It can help to prevent permanent injury, disability, and long-lasting (chronic) pain.  Contact a health care provider if:   You have a fever.   You develop a cough that makes your pain worse.   Your pain medicine is not helping.   Your pain does not get better over time.   You cannot return to your normal activities as planned or expected.  Get help right away if:   Your pain is very bad and it suddenly gets worse.   You are unable to move any body part (paralysis) that is below the level of your injury.   You have numbness, tingling, or weakness in any body part that is below the level of your injury.   You cannot control your bladder or bowels.  Summary   A spinal compression fracture is a collapse of the bones that form the spine (vertebrae).   With this type of fracture, the vertebrae become pushed (compressed) into a wedge shape.   Your symptoms and treatment will depend on the cause and severity of the fracture and how quickly it develops.   Some fractures may heal on their own with  supportive care. Fractures that cause the back to become misshapen, cause nerve pain or weakness, or do not respond to other treatment may be treated with surgery.  This information is not intended to replace advice given to you by your health care provider. Make sure you discuss any questions you have with your health care provider.  Document Released: 05/26/2005 Document Revised: 07/07/2017 Document Reviewed: 07/07/2017  Elsevier Interactive Patient Education  2019 [employer] Inc[employer]

## 2018-12-01 NOTE — Progress Notes (Signed)
Telephone Note New patient   I connected with Louis Carr  on 12/01/18 at 11:35 AM EDT by telephone and verified that I am speaking with the correct person using two identifiers. Location patient: home Location provider:work  Persons participating in the virtual visit: patient, provider  I discussed the limitations of evaluation and management by telemedicine and the availability of in person appointments. The patient expressed understanding and agreed to proceed.   HPI: 1. Chronic pain I.e low back and left leg due to crush injury 06/23/2018 at work this is workers comp case. He is in constant pain due to piece of heavy equipment coming down and pinning him which crushed his torso and legs and arms and oscillated back and forth. He did have sob to the point where he had loss of consciousness and had to be rescued by a coworker.  Leg leg hip pain especially his left thigh throbs worse at night. 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 Aris Lot Neurosurgery would not write this for him and rec he seek a PCP   Reviewed with patient today he will need a disability physician we can manage primary care issues but appears he needs disability physician and to go through his previous job workers comp to get an orthopedic referral   He has a pain clinic appt today at 4:30 pm in [place] but does not want to be on chronic narcotics. He also was referred by NS Dr. Aris Lot to PT/OT   2. CAD s/p stent x 1 w/o chest pain. He reports years ago he stopped plavix due to cramping and he is no longer taking  lisinopril 5 mg. He is only taking Aspirin 81 mg qd with FH of CAD/MI    ROS: See pertinent positives and negatives per HPI. General: no weight change  HEENT: denies sore throat  CV: no chest pain  Lungs: no sob  Ab: no abdominal pain  MSK: chronic pain in neck, back, legs, hips Neuro: no numbness/tingling  Skin: no issues   Past Medical History:  Diagnosis Date  . Coronary artery disease    stent x1  . Hypertension     Past Surgical History:  Procedure Laterality Date  . CORONARY ANGIOPLASTY WITH STENT PLACEMENT      Family History  Problem Relation Age of Onset  . Arthritis Mother   . CAD Father        cabg  . Heart attack Maternal Grandfather   . Stomach cancer Other        uncle  . Heart attack Other        m uncles  . Lung cancer Other        2nd cousin     SOCIAL HX:  Legally married wife left him x 5 x in 11/2018  Married 7 x  Kids x 3 daughter 70 y.o lives with him  2 sons (36 y.o)   DPR daughter Rubin Payor     Current Outpatient Medications:  .  aspirin EC 81 MG tablet, Take 81 mg by mouth daily.,  Disp: , Rfl:  .  clopidogrel (PLAVIX) 75 MG tablet, Take 75 mg by mouth daily with breakfast., Disp: , Rfl:  .  cyclobenzaprine (FLEXERIL) 10 MG tablet, Take 1 tablet (10 mg total) by mouth 3 (three) times daily as needed. (Patient not taking: Reported on 12/01/2018), Disp: 15 tablet, Rfl: 0 .  HYDROcodone-acetaminophen (NORCO/VICODIN) 5-325 MG per tablet, Take 2 tablets by mouth every 4 (four) hours as needed for moderate pain. (Patient not taking: Reported on 12/01/2018), Disp: 20 tablet, Rfl: 0 .  ibuprofen (ADVIL,MOTRIN) 600 MG tablet, Take 1 tablet (600 mg total) by mouth every 8 (eight) hours as needed. (Patient not taking: Reported on 12/01/2018), Disp: 15 tablet, Rfl: 0 .  lisinopril (PRINIVIL,ZESTRIL) 5 MG tablet, Take 5 mg by mouth daily., Disp: , Rfl:  .  oxyCODONE-acetaminophen (PERCOCET) 7.5-325 MG tablet, Take 1 tablet by mouth every 6 (six) hours as  needed for severe pain. (Patient not taking: Reported on 12/01/2018), Disp: 12 tablet, Rfl: 0  EXAM:  VITALS per patient if applicable:  GENERAL: alert, oriented, appears well and in no acute distress  PSYCH/NEURO: pleasant and cooperative, no obvious depression or anxiety, speech and thought processing grossly intact  ASSESSMENT AND PLAN:  Discussed the following assessment and plan:  Coronary artery disease involving native coronary artery of native heart without angina pectoris stent x 1 -on aspirin 81 mg qd  -prev seen [facility] cards Dr. Wendie SimmerKaren Alexander   Other chronic pain - Plan: Compression fracture of thoracic vertebra with routine healing, unspecified thoracic vertebral level, subsequent encounter Left leg pain  Left hip pain  Left thigh pain -I rec pt go through his attorney [employer] and Armed forces logistics/support/administrative officerermar [place] or [place] (phone [phone] phone [phone] fax [phone] since this is workers comp case and be referred by his previous employer to an orthopedic physician disc Dr. Ernest PineHooten or [facility] not sure if in his workers comp  -I will write letter to attorneys stating this  -He also needs to meet with a disability physician to evaluated for disability   Hm Consider flu shot  tdap 03/06/14  Consider prevnar, pna 23, shingrix   sch fasting labs appt here  1ppd age long term  FH lung cancer rec smoking cessation  Colonoscopy never had   consider DEXA in future  Check PSA and hep C   Former PCP Dr. Alphonsus SiasLetvak   I discussed the assessment and treatment plan with the patient. The patient was provided an opportunity to ask questions and all were answered. The patient agreed with the plan and demonstrated an understanding of the instructions.   The patient was advised to call back or seek an in-person evaluation if the symptoms worsen or if the condition fails to improve as anticipated.  Time spent 25 minutes  Bevelyn Bucklesracy N McLean-Scocuzza, MD

## 2018-12-02 ENCOUNTER — Telehealth: Payer: Self-pay | Admitting: Internal Medicine

## 2018-12-02 NOTE — Telephone Encounter (Signed)
I called pt to schedule a follow up in 4-6 weeks after fasting labs asap

## 2018-12-13 ENCOUNTER — Other Ambulatory Visit: Payer: Self-pay

## 2018-12-13 ENCOUNTER — Other Ambulatory Visit (INDEPENDENT_AMBULATORY_CARE_PROVIDER_SITE_OTHER): Payer: PRIVATE HEALTH INSURANCE

## 2018-12-13 DIAGNOSIS — Z125 Encounter for screening for malignant neoplasm of prostate: Secondary | ICD-10-CM

## 2018-12-13 DIAGNOSIS — E559 Vitamin D deficiency, unspecified: Secondary | ICD-10-CM

## 2018-12-13 DIAGNOSIS — Z13220 Encounter for screening for lipoid disorders: Secondary | ICD-10-CM

## 2018-12-13 DIAGNOSIS — Z13818 Encounter for screening for other digestive system disorders: Secondary | ICD-10-CM

## 2018-12-13 DIAGNOSIS — Z1159 Encounter for screening for other viral diseases: Secondary | ICD-10-CM

## 2018-12-13 DIAGNOSIS — I2510 Atherosclerotic heart disease of native coronary artery without angina pectoris: Secondary | ICD-10-CM

## 2018-12-13 DIAGNOSIS — E039 Hypothyroidism, unspecified: Secondary | ICD-10-CM

## 2018-12-13 DIAGNOSIS — Z1389 Encounter for screening for other disorder: Secondary | ICD-10-CM

## 2018-12-13 LAB — CBC WITH DIFFERENTIAL/PLATELET
Basophils Absolute: 0.1 10*3/uL (ref 0.0–0.1)
Basophils Relative: 0.8 % (ref 0.0–3.0)
Eosinophils Absolute: 0.2 10*3/uL (ref 0.0–0.7)
Eosinophils Relative: 1.9 % (ref 0.0–5.0)
HCT: 45.3 % (ref 39.0–52.0)
Hemoglobin: 15.6 g/dL (ref 13.0–17.0)
Lymphocytes Relative: 18.7 % (ref 12.0–46.0)
Lymphs Abs: 1.6 10*3/uL (ref 0.7–4.0)
MCHC: 34.3 g/dL (ref 30.0–36.0)
MCV: 97.2 fl (ref 78.0–100.0)
Monocytes Absolute: 0.6 10*3/uL (ref 0.1–1.0)
Monocytes Relative: 7.3 % (ref 3.0–12.0)
Neutro Abs: 6 10*3/uL (ref 1.4–7.7)
Neutrophils Relative %: 71.3 % (ref 43.0–77.0)
Platelets: 255 10*3/uL (ref 150.0–400.0)
RBC: 4.66 Mil/uL (ref 4.22–5.81)
RDW: 14.4 % (ref 11.5–15.5)
WBC: 8.5 10*3/uL (ref 4.0–10.5)

## 2018-12-13 LAB — URINALYSIS, ROUTINE W REFLEX MICROSCOPIC
Bilirubin Urine: NEGATIVE
Ketones, ur: NEGATIVE
Leukocytes,Ua: NEGATIVE
Nitrite: NEGATIVE
Specific Gravity, Urine: >=1.03 — AB (ref 1.000–1.030)
Total Protein, Urine: NEGATIVE
Urine Glucose: NEGATIVE
Urobilinogen, UA: 0.2 (ref 0.0–1.0)
pH: 5 (ref 5.0–8.0)

## 2018-12-13 LAB — VITAMIN D 25 HYDROXY (VIT D DEFICIENCY, FRACTURES): VITD: 33.8 ng/mL (ref 30.00–100.00)

## 2018-12-13 LAB — PSA, MEDICARE: PSA: 6.69 ng/ml — ABNORMAL HIGH (ref 0.10–4.00)

## 2018-12-13 LAB — COMPREHENSIVE METABOLIC PANEL
ALT: 16 U/L (ref 0–53)
AST: 14 U/L (ref 0–37)
Albumin: 3.9 g/dL (ref 3.5–5.2)
Alkaline Phosphatase: 68 U/L (ref 39–117)
BUN: 11 mg/dL (ref 6–23)
CO2: 26 mEq/L (ref 19–32)
Calcium: 8.6 mg/dL (ref 8.4–10.5)
Chloride: 106 mEq/L (ref 96–112)
Creatinine, Ser: 0.92 mg/dL (ref 0.40–1.50)
GFR: 82.38 mL/min (ref >60.00)
Glucose, Bld: 93 mg/dL (ref 70–99)
Potassium: 4.3 mEq/L (ref 3.5–5.1)
Sodium: 140 mEq/L (ref 135–145)
Total Bilirubin: 0.5 mg/dL (ref 0.2–1.2)
Total Protein: 6.1 g/dL (ref 6.0–8.3)

## 2018-12-13 LAB — LIPID PANEL
Cholesterol: 193 mg/dL (ref 0–200)
HDL: 50.5 mg/dL (ref >39.00)
LDL Cholesterol: 128 mg/dL — ABNORMAL HIGH (ref 0–99)
NonHDL: 142
Total CHOL/HDL Ratio: 4
Triglycerides: 70 mg/dL (ref 0.0–149.0)
VLDL: 14 mg/dL (ref 0.0–40.0)

## 2018-12-13 LAB — TSH: TSH: 0.9 u[IU]/mL (ref 0.35–4.50)

## 2018-12-13 NOTE — Addendum Note (Signed)
Addended by: Elpidio Galea T on: 12/13/2018 09:42 AM   Modules accepted: Orders

## 2018-12-13 NOTE — Addendum Note (Signed)
Addended by: Elpidio Galea T on: 12/13/2018 09:41 AM   Modules accepted: Orders

## 2018-12-14 LAB — HEPATITIS C ANTIBODY
Hepatitis C Ab: NONREACTIVE
SIGNAL TO CUT-OFF: 0.01 (ref <1.00)

## 2019-01-06 ENCOUNTER — Other Ambulatory Visit: Payer: Self-pay

## 2019-01-06 ENCOUNTER — Ambulatory Visit (INDEPENDENT_AMBULATORY_CARE_PROVIDER_SITE_OTHER): Payer: Self-pay | Admitting: Internal Medicine

## 2019-01-06 DIAGNOSIS — I2510 Atherosclerotic heart disease of native coronary artery without angina pectoris: Secondary | ICD-10-CM

## 2019-01-06 DIAGNOSIS — R972 Elevated prostate specific antigen [PSA]: Secondary | ICD-10-CM

## 2019-01-06 DIAGNOSIS — E785 Hyperlipidemia, unspecified: Secondary | ICD-10-CM

## 2019-01-06 DIAGNOSIS — R319 Hematuria, unspecified: Secondary | ICD-10-CM

## 2019-01-06 MED ORDER — ATORVASTATIN CALCIUM 20 MG PO TABS: 20.0000 mg | ORAL_TABLET | Freq: Every day | ORAL | 3 refills | Status: DC

## 2019-01-06 MED ORDER — CLOPIDOGREL BISULFATE 75 MG PO TABS: 75.0000 mg | ORAL_TABLET | Freq: Every day | ORAL | 3 refills | Status: DC

## 2019-01-06 MED ORDER — ASPIRIN EC 81 MG PO TBEC: 81.0000 mg | DELAYED_RELEASE_TABLET | Freq: Every day | ORAL | 3 refills | Status: DC

## 2019-01-06 NOTE — Progress Notes (Signed)
Telephone Note  I connected with Louis Carr  on 01/06/19 at  2:00 PM EDT by a telephone and verified that I am speaking with the correct person using two identifiers.  Location patient: home Location provider:work  Persons participating in the virtual visit: patient, provider  I discussed the limitations of evaluation and management by telemedicine and the availability of in person appointments. The patient expressed understanding and agreed to proceed.   HPI: 1. Reviewed labs LDL 128 h/o CAD with stent he [facility] but agreeble to go back he [facility]. He is not taking lisinopril 5 mg qd nor plavix 75 mg qd nor aspirin 81 mg qd BP was 134/74 at the pain clinic 12/01/18  2. Elevated PSA 6.69 and hematuria he is agreeable to see urology   ROS: See pertinent positives and negatives per HPI.  Past Medical History:  Diagnosis Date  . Coronary artery disease    stent x1  . Hypertension     Past Surgical History:  Procedure Laterality Date  . CORONARY ANGIOPLASTY WITH STENT PLACEMENT      Family History  Problem Relation Age of Onset  . Arthritis Mother   . CAD Father        cabg  . Heart attack Maternal Grandfather   . Stomach cancer Other        uncle  . Heart attack Other        m uncles  . Lung cancer Other        2nd cousin     SOCIAL HX:   Legally married wife left him x 5 x in 11/2018  Married 7 x  Kids x 3 daughter 66 y.o lives with him  2 sons (66 y.o)   DPR daughter Wilford SportsCassie   Current Outpatient Medications:  .  aspirin EC 81 MG tablet, Take 1 tablet (81 mg total) by mouth daily., Disp: 90 tablet, Rfl: 3 .  clopidogrel (PLAVIX) 75 MG tablet, Take 1 tablet (75 mg total) by mouth daily with breakfast., Disp: 90 tablet, Rfl: 3 .  cyclobenzaprine (FLEXERIL) 10 MG tablet, Take 1 tablet (10 mg total) by mouth 3 (three) times daily as needed., Disp: 15 tablet, Rfl: 0 .  HYDROcodone-acetaminophen (NORCO/VICODIN) 5-325 MG  per tablet, Take 2 tablets by mouth every 4 (four) hours as needed for moderate pain., Disp: 20 tablet, Rfl: 0 .  ibuprofen (ADVIL,MOTRIN) 600 MG tablet, Take 1 tablet (600 mg total) by mouth every 8 (eight) hours as needed., Disp: 15 tablet, Rfl: 0 .  oxyCODONE-acetaminophen (PERCOCET) 7.5-325 MG tablet, Take 1 tablet by mouth every 6 (six) hours as needed for severe pain., Disp: 12 tablet, Rfl: 0 .  atorvastatin (LIPITOR) 20 MG tablet, Take 1 tablet (20 mg total) by mouth daily at 6 PM., Disp: 90 tablet, Rfl: 3 .  lisinopril (PRINIVIL,ZESTRIL) 5 MG tablet, Take 5 mg by mouth daily., Disp: , Rfl:   EXAM:  VITALS per patient if applicable:  GENERAL: alert, oriented, appears well and in no acute distress  PSYCH/NEURO: pleasant and cooperative, no obvious depression or anxiety, speech and thought processing grossly intact  ASSESSMENT AND PLAN:  Discussed the following assessment and plan:  Elevated PSA - Plan: Ambulatory referral to Urology Dr. Duard BradyBrandon/Sninsky Hematuria  Coronary artery disease involving native coronary artery of native heart without angina pectoris - Plan: clopidogrel (PLAVIX) 75 MG tablet, aspirin EC 81 MG tablet, Ambulatory [facility]  atorvastatin (LIPITOR) 20 MG tablet qhs resume 40 mg caused leg cramps  Disc goal LDL<70  Hyperlipidemia, unspecified hyperlipidemia type - Plan: atorvastatin (LIPITOR) 20 MG tablet qhs see above   Hm Consider flu shot  tdap 03/06/14  Consider prevnar, pna 23, shingrix   1ppd age long term  FH lung cancer rec smoking cessation  Colonoscopy never had  consider DEXA in future  elevtaed PSA 6.69 and hematuria referred to Dr. Diamantina Providence Negative hep C   Former PCP Dr. Silvio Pate   I discussed the assessment and treatment plan with the patient. The patient was provided an opportunity to ask questions and all were answered. The patient agreed with the plan and demonstrated an understanding of the  instructions.   The patient was advised to call back or seek an in-person evaluation if the symptoms worsen or if the condition fails to improve as anticipated.  Time spetn 15 minutes  Delorise Jackson, MD

## 2019-02-03 ENCOUNTER — Ambulatory Visit: Payer: Self-pay | Admitting: Urology

## 2019-02-03 ENCOUNTER — Encounter: Payer: Self-pay | Admitting: Urology

## 2019-07-13 ENCOUNTER — Ambulatory Visit (INDEPENDENT_AMBULATORY_CARE_PROVIDER_SITE_OTHER): Payer: Self-pay | Admitting: Internal Medicine

## 2019-07-13 ENCOUNTER — Encounter: Payer: Self-pay | Admitting: Internal Medicine

## 2019-07-13 ENCOUNTER — Other Ambulatory Visit: Payer: Self-pay

## 2019-07-13 VITALS — Ht 71.0 in | Wt 175.0 lb

## 2019-07-13 DIAGNOSIS — I2510 Atherosclerotic heart disease of native coronary artery without angina pectoris: Secondary | ICD-10-CM

## 2019-07-13 DIAGNOSIS — G8929 Other chronic pain: Secondary | ICD-10-CM

## 2019-07-13 DIAGNOSIS — Z1389 Encounter for screening for other disorder: Secondary | ICD-10-CM

## 2019-07-13 DIAGNOSIS — R972 Elevated prostate specific antigen [PSA]: Secondary | ICD-10-CM

## 2019-07-13 DIAGNOSIS — Z1329 Encounter for screening for other suspected endocrine disorder: Secondary | ICD-10-CM

## 2019-07-13 NOTE — Progress Notes (Signed)
telephone Note  I connected with Louis Carr  on 07/13/19 at  2:15 PM EST by a telephone and verified that I am speaking with the correct person using two identifiers.  Location patient: home Location provider:work or home office Persons participating in the virtual visit: patient, provider  I discussed the limitations of evaluation and management by telemedicine and the availability of in person appointments. The patient expressed understanding and agreed to proceed.   HPI: 1. Chronic pain > 1 year not getting workers come any longer on Geographical information systems officer and [employer] pain clinic would not give narcotics and he did not pick up topical solution and he is still in pain from work related accident but did get pay out from workers comp 2. Elevated PSA > 6 agreeable to see urology had not in the past  3. CAD has not f/u cards Dr. Sheppard Coil stopped lis 5 mg qd 2/2 hypotension, not taking lipitor, not taking plavix only aspirin he will call to get f/u    ROS: See pertinent positives and negatives per HPI.  Past Medical History:  Diagnosis Date  . Coronary artery disease    stent x1  . Hypertension     Past Surgical History:  Procedure Laterality Date  . CORONARY ANGIOPLASTY WITH STENT PLACEMENT      Family History  Problem Relation Age of Onset  . Arthritis Mother   . CAD Father        cabg  . Heart attack Maternal Grandfather   . Stomach cancer Other        uncle  . Heart attack Other        m uncles  . Lung cancer Other        2nd cousin     SOCIAL HX:  Lives at home   Current Outpatient Medications:  .  aspirin EC 81 MG tablet, Take 1 tablet (81 mg total) by mouth daily., Disp: 90 tablet, Rfl: 3 .  HYDROcodone-acetaminophen (NORCO/VICODIN) 5-325 MG per tablet, Take 2 tablets by mouth every 4 (four) hours as needed for moderate pain., Disp: 20 tablet, Rfl: 0 .  ibuprofen (ADVIL,MOTRIN) 600 MG tablet, Take 1 tablet (600 mg total) by mouth every 8  (eight) hours as needed., Disp: 15 tablet, Rfl: 0 .  atorvastatin (LIPITOR) 20 MG tablet, Take 1 tablet (20 mg total) by mouth daily at 6 PM. (Patient not taking: Reported on 07/13/2019), Disp: 90 tablet, Rfl: 3 .  clopidogrel (PLAVIX) 75 MG tablet, Take 1 tablet (75 mg total) by mouth daily with breakfast. (Patient not taking: Reported on 07/13/2019), Disp: 90 tablet, Rfl: 3  EXAM:  VITALS per patient if applicable:  GENERAL: alert, oriented, appears well and in no acute distress  PSYCH/NEURO: pleasant and cooperative, no obvious depression or anxiety, speech and thought processing grossly intact  ASSESSMENT AND PLAN:  Discussed the following assessment and plan:  Elevated PSA - Plan: Ambulatory referral to Urology  Other chronic pain F/u pain clinic prn establish with new if needed   CAD s/p stent  Not taking plavix on aspirin 81 mg qd  Not taking lipitor  These are meds pt needs disc today  Not taking lisinopril 5 mg  He was given # to Dr. Sheppard Coil cards to call and make f/u given heart hx   Hm Consider flu shot never had declines covid vx not going to take  tdap 03/06/14  Consider prevnar, pna 23, shingrix   1ppd age long term FH lung cancer rec  smoking cessation  Colonoscopy never had  -consider cologuard in the future  consider DEXA in future  elevtaed PSA 6.69 and hematuria referred to Dr. Richardo Hanks referral sent again  Negative hep C  Former PCP Dr. Alphonsus Sias -we discussed possible serious and likely etiologies, options for evaluation and workup, limitations of telemedicine visit vs in person visit, treatment, treatment risks and precautions. Pt prefers to treat via telemedicine empirically rather then risking or undertaking an in person visit at this moment. Patient agrees to seek prompt in person care if worsening, new symptoms arise, or if is not improving with treatment.   I discussed the assessment and treatment plan with the patient. The patient was provided an  opportunity to ask questions and all were answered. The patient agreed with the plan and demonstrated an understanding of the instructions.   The patient was advised to call back or seek an in-person evaluation if the symptoms worsen or if the condition fails to improve as anticipated.  Time spent 20 minutes  Bevelyn Buckles, MD

## 2019-07-14 ENCOUNTER — Telehealth: Payer: Self-pay | Admitting: Internal Medicine

## 2019-07-14 NOTE — Telephone Encounter (Signed)
I called pt and left vm to call ofc to sch Return in about 6 months (around 01/10/2020) for fasting labs 12/13/19 and f/u in 6 months.

## 2019-07-20 ENCOUNTER — Telehealth: Payer: Self-pay | Admitting: Internal Medicine

## 2019-07-20 NOTE — Telephone Encounter (Signed)
-----   Message from Meda Klinefelter sent at 07/14/2019  1:31 PM EST ----- Can you please call patient?  Thanks, Dawn ----- Message ----- From: McLean-Scocuzza, Pasty Spillers, MD Sent: 07/13/2019   6:58 PM EST To: Meda Klinefelter  Sorry pt tells me he has medicare/medicaid and [place] but it says no insurance  Can he be contacted about this

## 2019-07-20 NOTE — Telephone Encounter (Signed)
I spoke to the patient regarding updating his insurance information he stated he will call the office back to update. He was driving at the time.
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
## 2019-08-10 ENCOUNTER — Other Ambulatory Visit: Payer: Self-pay

## 2019-08-10 ENCOUNTER — Emergency Department
Admission: EM | Admit: 2019-08-10 | Discharge: 2019-08-10 | Disposition: A | Discharge status: Other Acute Inpatient Hospital | Payer: Medicare HMO | Attending: Emergency Medicine | Admitting: Emergency Medicine

## 2019-08-10 DIAGNOSIS — F17210 Nicotine dependence, cigarettes, uncomplicated: Secondary | ICD-10-CM | POA: Diagnosis not present

## 2019-08-10 DIAGNOSIS — M2550 Pain in unspecified joint: Secondary | ICD-10-CM | POA: Diagnosis not present

## 2019-08-10 DIAGNOSIS — Z7982 Long term (current) use of aspirin: Secondary | ICD-10-CM | POA: Insufficient documentation

## 2019-08-10 DIAGNOSIS — Z9861 Coronary angioplasty status: Secondary | ICD-10-CM | POA: Insufficient documentation

## 2019-08-10 DIAGNOSIS — I2510 Atherosclerotic heart disease of native coronary artery without angina pectoris: Secondary | ICD-10-CM | POA: Diagnosis not present

## 2019-08-10 DIAGNOSIS — Z7401 Bed confinement status: Secondary | ICD-10-CM | POA: Diagnosis not present

## 2019-08-10 DIAGNOSIS — T24232A Burn of second degree of left lower leg, initial encounter: Secondary | ICD-10-CM | POA: Diagnosis present

## 2019-08-10 DIAGNOSIS — T310 Burns involving less than 10% of body surface: Secondary | ICD-10-CM | POA: Diagnosis not present

## 2019-08-10 DIAGNOSIS — T24302A Burn of third degree of unspecified site of left lower limb, except ankle and foot, initial encounter: Secondary | ICD-10-CM | POA: Diagnosis not present

## 2019-08-10 DIAGNOSIS — Y9389 Activity, other specified: Secondary | ICD-10-CM | POA: Diagnosis not present

## 2019-08-10 DIAGNOSIS — T25312A Burn of third degree of left ankle, initial encounter: Secondary | ICD-10-CM | POA: Diagnosis not present

## 2019-08-10 DIAGNOSIS — Y929 Unspecified place or not applicable: Secondary | ICD-10-CM | POA: Insufficient documentation

## 2019-08-10 DIAGNOSIS — Y999 Unspecified external cause status: Secondary | ICD-10-CM | POA: Insufficient documentation

## 2019-08-10 DIAGNOSIS — T24332A Burn of third degree of left lower leg, initial encounter: Secondary | ICD-10-CM | POA: Diagnosis not present

## 2019-08-10 DIAGNOSIS — I119 Hypertensive heart disease without heart failure: Secondary | ICD-10-CM | POA: Insufficient documentation

## 2019-08-10 DIAGNOSIS — I10 Essential (primary) hypertension: Secondary | ICD-10-CM | POA: Diagnosis not present

## 2019-08-10 DIAGNOSIS — G8911 Acute pain due to trauma: Secondary | ICD-10-CM | POA: Diagnosis not present

## 2019-08-10 DIAGNOSIS — X030XXA Exposure to flames in controlled fire, not in building or structure, initial encounter: Secondary | ICD-10-CM | POA: Insufficient documentation

## 2019-08-10 DIAGNOSIS — T300 Burn of unspecified body region, unspecified degree: Secondary | ICD-10-CM

## 2019-08-10 DIAGNOSIS — Z7902 Long term (current) use of antithrombotics/antiplatelets: Secondary | ICD-10-CM | POA: Diagnosis not present

## 2019-08-10 DIAGNOSIS — R001 Bradycardia, unspecified: Secondary | ICD-10-CM | POA: Diagnosis not present

## 2019-08-10 DIAGNOSIS — E785 Hyperlipidemia, unspecified: Secondary | ICD-10-CM | POA: Diagnosis not present

## 2019-08-10 DIAGNOSIS — Z01810 Encounter for preprocedural cardiovascular examination: Secondary | ICD-10-CM | POA: Diagnosis not present

## 2019-08-10 DIAGNOSIS — Z23 Encounter for immunization: Secondary | ICD-10-CM | POA: Diagnosis not present

## 2019-08-10 DIAGNOSIS — Z20822 Contact with and (suspected) exposure to covid-19: Secondary | ICD-10-CM | POA: Insufficient documentation

## 2019-08-10 DIAGNOSIS — Z79899 Other long term (current) drug therapy: Secondary | ICD-10-CM | POA: Diagnosis not present

## 2019-08-10 DIAGNOSIS — Z955 Presence of coronary angioplasty implant and graft: Secondary | ICD-10-CM | POA: Diagnosis not present

## 2019-08-10 DIAGNOSIS — Z03818 Encounter for observation for suspected exposure to other biological agents ruled out: Secondary | ICD-10-CM | POA: Diagnosis not present

## 2019-08-10 DIAGNOSIS — R0902 Hypoxemia: Secondary | ICD-10-CM | POA: Diagnosis not present

## 2019-08-10 DIAGNOSIS — T25322A Burn of third degree of left foot, initial encounter: Secondary | ICD-10-CM | POA: Diagnosis not present

## 2019-08-10 LAB — CBC WITH DIFFERENTIAL/PLATELET
Abs Immature Granulocytes: 0.03 10*3/uL (ref 0.00–0.07)
Basophils Absolute: 0.1 10*3/uL (ref 0.0–0.1)
Basophils Relative: 1 %
Eosinophils Absolute: 0.1 10*3/uL (ref 0.0–0.5)
Eosinophils Relative: 1 %
HCT: 44.6 % (ref 39.0–52.0)
Hemoglobin: 15.2 g/dL (ref 13.0–17.0)
Immature Granulocytes: 0 %
Lymphocytes Relative: 26 %
Lymphs Abs: 2.5 10*3/uL (ref 0.7–4.0)
MCH: 32.5 pg (ref 26.0–34.0)
MCHC: 34.1 g/dL (ref 30.0–36.0)
MCV: 95.3 fL (ref 80.0–100.0)
Monocytes Absolute: 0.6 10*3/uL (ref 0.1–1.0)
Monocytes Relative: 7 %
Neutro Abs: 6.5 10*3/uL (ref 1.7–7.7)
Neutrophils Relative %: 65 %
Platelets: 265 10*3/uL (ref 150–400)
RBC: 4.68 MIL/uL (ref 4.22–5.81)
RDW: 13 % (ref 11.5–15.5)
WBC: 9.8 10*3/uL (ref 4.0–10.5)
nRBC: 0 % (ref 0.0–0.2)

## 2019-08-10 LAB — COMPREHENSIVE METABOLIC PANEL
ALT: 21 U/L (ref 0–44)
AST: 22 U/L (ref 15–41)
Albumin: 4 g/dL (ref 3.5–5.0)
Alkaline Phosphatase: 61 U/L (ref 38–126)
Anion gap: 13 (ref 5–15)
BUN: 12 mg/dL (ref 8–23)
CO2: 25 mmol/L (ref 22–32)
Calcium: 9.1 mg/dL (ref 8.9–10.3)
Chloride: 102 mmol/L (ref 98–111)
Creatinine, Ser: 1.02 mg/dL (ref 0.61–1.24)
GFR calc Af Amer: >60 mL/min (ref >60)
GFR calc non Af Amer: >60 mL/min (ref >60)
Glucose, Bld: 170 mg/dL — ABNORMAL HIGH (ref 70–99)
Potassium: 3.8 mmol/L (ref 3.5–5.1)
Sodium: 140 mmol/L (ref 135–145)
Total Bilirubin: 0.7 mg/dL (ref 0.3–1.2)
Total Protein: 6.9 g/dL (ref 6.5–8.1)

## 2019-08-10 LAB — RESPIRATORY PANEL BY RT PCR (FLU A&B, COVID)
Influenza A by PCR: NEGATIVE
Influenza B by PCR: NEGATIVE
SARS Coronavirus 2 by RT PCR: NEGATIVE

## 2019-08-10 LAB — SAMPLE TO BLOOD BANK

## 2019-08-10 MED ORDER — SODIUM CHLORIDE 0.9 % IV BOLUS
1000.0000 mL | Freq: Once | INTRAVENOUS | Status: AC
  Administered 2019-08-10: 1000 mL via INTRAVENOUS

## 2019-08-10 MED ORDER — HYDROMORPHONE HCL 1 MG/ML IJ SOLN
INTRAMUSCULAR | Status: AC
  Filled 2019-08-10: qty 1

## 2019-08-10 MED ORDER — HYDROMORPHONE HCL 1 MG/ML IJ SOLN
1.0000 mg | INTRAMUSCULAR | Status: AC | PRN
  Administered 2019-08-10: 1 mg via INTRAVENOUS
  Filled 2019-08-10: qty 1

## 2019-08-10 MED ORDER — SILVER SULFADIAZINE 1 % EX CREA: TOPICAL_CREAM | Freq: Once | CUTANEOUS | Status: AC

## 2019-08-10 MED ORDER — HYDROMORPHONE HCL 1 MG/ML IJ SOLN
0.5000 mg | Freq: Once | INTRAMUSCULAR | Status: AC
  Administered 2019-08-10: 0.5 mg via INTRAVENOUS

## 2019-08-10 MED ORDER — ONDANSETRON HCL 4 MG/2ML IJ SOLN
INTRAMUSCULAR | Status: AC
  Filled 2019-08-10: qty 2

## 2019-08-10 MED ORDER — HYDROMORPHONE HCL 1 MG/ML IJ SOLN
0.5000 mg | INTRAMUSCULAR | Status: DC | PRN
  Administered 2019-08-10 (×2): 0.5 mg via INTRAVENOUS
  Filled 2019-08-10: qty 1

## 2019-08-10 MED ORDER — ONDANSETRON HCL 4 MG/2ML IJ SOLN
4.0000 mg | Freq: Once | INTRAMUSCULAR | Status: AC
  Administered 2019-08-10: 4 mg via INTRAVENOUS

## 2019-08-10 MED ORDER — TETANUS-DIPHTH-ACELL PERTUSSIS 5-2.5-18.5 LF-MCG/0.5 IM SUSP
0.5000 mL | Freq: Once | INTRAMUSCULAR | Status: AC
  Administered 2019-08-10: 0.5 mL via INTRAMUSCULAR
  Filled 2019-08-10: qty 0.5

## 2019-08-10 NOTE — ED Notes (Signed)
Pt c/o increased pain, prn pain meds administered
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
## 2019-08-10 NOTE — ED Notes (Signed)
Pt c/o increased pain, prn pain medications administered

## 2019-08-10 NOTE — ED Notes (Signed)
Dr Funke at beside. °
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
## 2019-08-10 NOTE — ED Notes (Signed)
Called [facility] for transfer [phone]

## 2019-08-15 ENCOUNTER — Encounter: Payer: Self-pay | Admitting: Urology

## 2019-08-15 ENCOUNTER — Ambulatory Visit: Payer: Self-pay | Admitting: Urology

## 2019-08-22 ENCOUNTER — Telehealth: Payer: Self-pay

## 2019-08-22 NOTE — Telephone Encounter (Signed)
Patient plans to keep hospital follow up appointment with [facility]. Notes he is recovering well and changing bandages appropriately. Scheduled annual wellness via telephone tomorrow with Nurse Health Advisor; requests medication assistance to stop smoking.

## 2019-08-22 NOTE — Telephone Encounter (Signed)
Noted. Will follow.  

## 2019-08-22 NOTE — Telephone Encounter (Signed)
If you want to cosign either chantix or wellbutrin to me 08/23/19 will sign off after you review options with pt  Otherwise he can try gum/patches over the counter

## 2019-08-23 ENCOUNTER — Ambulatory Visit (INDEPENDENT_AMBULATORY_CARE_PROVIDER_SITE_OTHER): Payer: Medicare HMO

## 2019-08-23 ENCOUNTER — Encounter (INDEPENDENT_AMBULATORY_CARE_PROVIDER_SITE_OTHER): Payer: Self-pay

## 2019-08-23 ENCOUNTER — Other Ambulatory Visit: Payer: Self-pay

## 2019-08-23 VITALS — Ht 70.0 in | Wt 190.0 lb

## 2019-08-23 DIAGNOSIS — Z0000 Encounter for general adult medical examination without abnormal findings: Secondary | ICD-10-CM

## 2019-08-23 NOTE — Telephone Encounter (Signed)
Smoking cessation discussed during awv. Patient declined all options to assist with stop smoking and plans to try quitting on his own first. Encouraged to follow up with his doctor as needed through the process.

## 2019-08-23 NOTE — Patient Instructions (Addendum)
  Louis Carr , Thank you for taking time to come for your Medicare Wellness Visit. I appreciate your ongoing commitment to your health goals. Please review the following plan we discussed and let me know if I can assist you in the future.   These are the goals we discussed: Goals      Patient Stated   . I want to quit smoking, I just have to make my mind up. (pt-stated)      Other   . Follow up with Primary Care Provider     As needed       This is a list of the screening recommended for you and due dates:  Health Maintenance  Topic Date Due  . Flu Shot  09/07/2019*  . Colon Cancer Screening  08/22/2020*  . Tetanus Vaccine  08/09/2029  .  Hepatitis C: One time screening is recommended by Center for Disease Control  (CDC) for  adults born from [birth year] through 1963.   Completed  . Pneumonia vaccines  Discontinued  *Topic was postponed. The date shown is not the original due date.
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
## 2019-08-23 NOTE — Telephone Encounter (Signed)
Noted  TMS 

## 2019-08-26 DIAGNOSIS — T310 Burns involving less than 10% of body surface: Secondary | ICD-10-CM | POA: Diagnosis not present

## 2019-08-26 DIAGNOSIS — T24232D Burn of second degree of left lower leg, subsequent encounter: Secondary | ICD-10-CM | POA: Diagnosis not present

## 2019-08-30 DIAGNOSIS — T310 Burns involving less than 10% of body surface: Secondary | ICD-10-CM | POA: Diagnosis not present

## 2019-08-30 DIAGNOSIS — T24232D Burn of second degree of left lower leg, subsequent encounter: Secondary | ICD-10-CM | POA: Diagnosis not present

## 2019-09-07 DIAGNOSIS — T24332D Burn of third degree of left lower leg, subsequent encounter: Secondary | ICD-10-CM | POA: Diagnosis not present

## 2019-09-14 DIAGNOSIS — T310 Burns involving less than 10% of body surface: Secondary | ICD-10-CM | POA: Diagnosis not present

## 2019-09-14 DIAGNOSIS — T24202D Burn of second degree of unspecified site of left lower limb, except ankle and foot, subsequent encounter: Secondary | ICD-10-CM | POA: Diagnosis not present

## 2019-09-22 DIAGNOSIS — L299 Pruritus, unspecified: Secondary | ICD-10-CM | POA: Diagnosis not present

## 2019-09-22 DIAGNOSIS — M792 Neuralgia and neuritis, unspecified: Secondary | ICD-10-CM | POA: Diagnosis not present

## 2019-09-22 DIAGNOSIS — T24302A Burn of third degree of unspecified site of left lower limb, except ankle and foot, initial encounter: Secondary | ICD-10-CM | POA: Diagnosis not present

## 2019-09-22 DIAGNOSIS — T24202A Burn of second degree of unspecified site of left lower limb, except ankle and foot, initial encounter: Secondary | ICD-10-CM | POA: Diagnosis not present

## 2019-09-22 DIAGNOSIS — R600 Localized edema: Secondary | ICD-10-CM | POA: Diagnosis not present

## 2019-09-22 DIAGNOSIS — T310 Burns involving less than 10% of body surface: Secondary | ICD-10-CM | POA: Diagnosis not present

## 2019-10-04 DIAGNOSIS — T24232D Burn of second degree of left lower leg, subsequent encounter: Secondary | ICD-10-CM | POA: Diagnosis not present

## 2019-10-04 DIAGNOSIS — T310 Burns involving less than 10% of body surface: Secondary | ICD-10-CM | POA: Diagnosis not present

## 2019-10-04 DIAGNOSIS — G629 Polyneuropathy, unspecified: Secondary | ICD-10-CM | POA: Diagnosis not present

## 2019-10-04 DIAGNOSIS — L299 Pruritus, unspecified: Secondary | ICD-10-CM | POA: Diagnosis not present

## 2019-10-18 DIAGNOSIS — T24202D Burn of second degree of unspecified site of left lower limb, except ankle and foot, subsequent encounter: Secondary | ICD-10-CM | POA: Diagnosis not present

## 2019-10-18 DIAGNOSIS — L299 Pruritus, unspecified: Secondary | ICD-10-CM | POA: Diagnosis not present

## 2019-10-18 DIAGNOSIS — M792 Neuralgia and neuritis, unspecified: Secondary | ICD-10-CM | POA: Diagnosis not present

## 2019-10-18 DIAGNOSIS — T24302D Burn of third degree of unspecified site of left lower limb, except ankle and foot, subsequent encounter: Secondary | ICD-10-CM | POA: Diagnosis not present

## 2019-10-18 DIAGNOSIS — T310 Burns involving less than 10% of body surface: Secondary | ICD-10-CM | POA: Diagnosis not present

## 2019-10-18 DIAGNOSIS — R600 Localized edema: Secondary | ICD-10-CM | POA: Diagnosis not present

## 2019-11-29 DIAGNOSIS — T24302S Burn of third degree of unspecified site of left lower limb, except ankle and foot, sequela: Secondary | ICD-10-CM | POA: Diagnosis not present

## 2019-11-29 DIAGNOSIS — T24302D Burn of third degree of unspecified site of left lower limb, except ankle and foot, subsequent encounter: Secondary | ICD-10-CM | POA: Diagnosis not present

## 2020-08-23 ENCOUNTER — Ambulatory Visit: Payer: Medicare HMO

## 2020-08-24 ENCOUNTER — Ambulatory Visit (INDEPENDENT_AMBULATORY_CARE_PROVIDER_SITE_OTHER): Payer: Medicare HMO

## 2020-08-24 VITALS — Ht 70.0 in | Wt 190.0 lb

## 2020-08-24 DIAGNOSIS — Z0000 Encounter for general adult medical examination without abnormal findings: Secondary | ICD-10-CM

## 2020-08-24 NOTE — Patient Instructions (Addendum)
[name] , Thank you for taking time to come for your Medicare Wellness Visit. I appreciate your ongoing commitment to your health goals. Please review the following plan we discussed and let me know if I can assist you in the future.   These are the goals we discussed: Goals      Patient Stated   .  I want to quit smoking, I just have to make my mind up. (pt-stated)      Other   .  Follow up with Primary Care Provider      As needed       This is a list of the screening recommended for you and due dates:  Health Maintenance  Topic Date Due  . Colon Cancer Screening  Never done  . Tetanus Vaccine  08/09/2029  .  Hepatitis C: One time screening is recommended by Center for Disease Control  (CDC) for  adults born from [birth year] through 1963.   Completed  . HPV Vaccine  Aged Out  . Flu Shot  Discontinued  . COVID-19 Vaccine  Discontinued  . Pneumonia vaccines  Discontinued    Immunizations Immunization History  Administered Date(s) Administered  . Td 06/10/2000  . Tdap 03/06/2014, 08/10/2019   Advanced directives: not yet completed.   Conditions/risks identified: none new.  Follow up in one year for your annual wellness visit.   Preventive Care 57 Years and Older, Male Preventive care refers to lifestyle choices and visits with your health care provider that can promote health and wellness. What does preventive care include?  A yearly physical exam. This is also called an annual well check.  Dental exams once or twice a year.  Routine eye exams. Ask your health care provider how often you should have your eyes checked.  Personal lifestyle choices, including:  Daily care of your teeth and gums.  Regular physical activity.  Eating a healthy diet.  Avoiding tobacco and drug use.  Limiting alcohol use.  Practicing safe sex.  Taking low doses of aspirin every day.  Taking vitamin and mineral supplements as recommended by your health care provider. What  happens during an annual well check? The services and screenings done by your health care provider during your annual well check will depend on your age, overall health, lifestyle risk factors, and family history of disease. Counseling  Your health care provider may ask you questions about your:  Alcohol use.  Tobacco use.  Drug use.  Emotional well-being.  Home and relationship well-being.  Sexual activity.  Eating habits.  History of falls.  Memory and ability to understand (cognition).  Work and work Astronomer. Screening  You may have the following tests or measurements:  Height, weight, and BMI.  Blood pressure.  Lipid and cholesterol levels. These may be checked every 5 years, or more frequently if you are over 85 years old.  Skin check.  Lung cancer screening. You may have this screening every year starting at age 25 if you have a 30-pack-year history of smoking and currently smoke or have quit within the past 15 years.  Fecal occult blood test (FOBT) of the stool. You may have this test every year starting at age 66.  Flexible sigmoidoscopy or colonoscopy. You may have a sigmoidoscopy every 5 years or a colonoscopy every 10 years starting at age 25.  Prostate cancer screening. Recommendations will vary depending on your family history and other risks.  Hepatitis C blood test.  Hepatitis B blood test.  Sexually  transmitted disease (STD) testing.  Diabetes screening. This is done by checking your blood sugar (glucose) after you have not eaten for a while (fasting). You may have this done every 1-3 years.  Abdominal aortic aneurysm (AAA) screening. You may need this if you are a current or former smoker.  Osteoporosis. You may be screened starting at age 86 if you are at high risk. Talk with your health care provider about your test results, treatment options, and if necessary, the need for more tests. Vaccines  Your health care provider may recommend  certain vaccines, such as:  Influenza vaccine. This is recommended every year.  Tetanus, diphtheria, and acellular pertussis (Tdap, Td) vaccine. You may need a Td booster every 10 years.  Zoster vaccine. You may need this after age 54.  Pneumococcal 13-valent conjugate (PCV13) vaccine. One dose is recommended after age 73.  Pneumococcal polysaccharide (PPSV23) vaccine. One dose is recommended after age 45. Talk to your health care provider about which screenings and vaccines you need and how often you need them. This information is not intended to replace advice given to you by your health care provider. Make sure you discuss any questions you have with your health care provider. Document Released: 06/22/2015 Document Revised: 02/13/2016 Document Reviewed: 03/27/2015 Elsevier Interactive Patient Education  2017 [place] Prevention in the Home Falls can cause injuries. They can happen to people of all ages. There are many things you can do to make your home safe and to help prevent falls. What can I do on the outside of my home?  Regularly fix the edges of walkways and driveways and fix any cracks.  Remove anything that might make you trip as you walk through a door, such as a raised step or threshold.  Trim any bushes or trees on the path to your home.  Use bright outdoor lighting.  Clear any walking paths of anything that might make someone trip, such as rocks or tools.  Regularly check to see if handrails are loose or broken. Make sure that both sides of any steps have handrails.  Any raised decks and porches should have guardrails on the edges.  Have any leaves, snow, or ice cleared regularly.  Use sand or salt on walking paths during winter.  Clean up any spills in your garage right away. This includes oil or grease spills. What can I do in the bathroom?  Use night lights.  Install grab bars by the toilet and in the tub and shower. Do not use towel bars as  grab bars.  Use non-skid mats or decals in the tub or shower.  If you need to sit down in the shower, use a plastic, non-slip stool.  Keep the floor dry. Clean up any water that spills on the floor as soon as it happens.  Remove soap buildup in the tub or shower regularly.  Attach bath mats securely with double-sided non-slip rug tape.  Do not have throw rugs and other things on the floor that can make you trip. What can I do in the bedroom?  Use night lights.  Make sure that you have a light by your bed that is easy to reach.  Do not use any sheets or blankets that are too big for your bed. They should not hang down onto the floor.  Have a firm chair that has side arms. You can use this for support while you get dressed.  Do not have throw rugs and other things  on the floor that can make you trip. What can I do in the kitchen?  Clean up any spills right away.  Avoid walking on wet floors.  Keep items that you use a lot in easy-to-reach places.  If you need to reach something above you, use a strong step stool that has a grab bar.  Keep electrical cords out of the way.  Do not use floor polish or wax that makes floors slippery. If you must use wax, use non-skid floor wax.  Do not have throw rugs and other things on the floor that can make you trip. What can I do with my stairs?  Do not leave any items on the stairs.  Make sure that there are handrails on both sides of the stairs and use them. Fix handrails that are broken or loose. Make sure that handrails are as long as the stairways.  Check any carpeting to make sure that it is firmly attached to the stairs. Fix any carpet that is loose or worn.  Avoid having throw rugs at the top or bottom of the stairs. If you do have throw rugs, attach them to the floor with carpet tape.  Make sure that you have a light switch at the top of the stairs and the bottom of the stairs. If you do not have them, ask someone to add them  for you. What else can I do to help prevent falls?  Wear shoes that:  Do not have high heels.  Have rubber bottoms.  Are comfortable and fit you well.  Are closed at the toe. Do not wear sandals.  If you use a stepladder:  Make sure that it is fully opened. Do not climb a closed stepladder.  Make sure that both sides of the stepladder are locked into place.  Ask someone to hold it for you, if possible.  Clearly mark and make sure that you can see:  Any grab bars or handrails.  First and last steps.  Where the edge of each step is.  Use tools that help you move around (mobility aids) if they are needed. These include:  Canes.  Walkers.  Scooters.  Crutches.  Turn on the lights when you go into a dark area. Replace any light bulbs as soon as they burn out.  Set up your furniture so you have a clear path. Avoid moving your furniture around.  If any of your floors are uneven, fix them.  If there are any pets around you, be aware of where they are.  Review your medicines with your doctor. Some medicines can make you feel dizzy. This can increase your chance of falling. Ask your doctor what other things that you can do to help prevent falls. This information is not intended to replace advice given to you by your health care provider. Make sure you discuss any questions you have with your health care provider. Document Released: 03/22/2009 Document Revised: 11/01/2015 Document Reviewed: 06/30/2014 Elsevier Interactive Patient Education  2017 [employer].

## 2020-08-24 NOTE — Progress Notes (Signed)
Subjective:   Louis Carr is a 70 y.o. male who presents for Medicare Annual/Subsequent preventive examination.  Review of Systems    No ROS.  Medicare Wellness Virtual Visit.   Cardiac Risk Factors include: advanced age (>60men, >74 women);male gender     Objective:    Today's Vitals   08/24/20 1117  Weight: 190 lb (86.2 kg)  Height: 5\' 10"  (1.778 m)   Body mass index is 27.26 kg/m.  Advanced Directives 08/24/2020 08/23/2019 06/21/2018  Does Patient Have a Medical Advance Directive? No No No  Would patient like information on creating a medical advance directive? No - Patient declined No - Patient declined No - Patient declined    Current Medications (verified) Outpatient Encounter Medications as of 08/24/2020  Medication Sig  . aspirin EC 81 MG tablet Take 1 tablet (81 mg total) by mouth daily. (Patient not taking: Reported on 08/23/2019)  . atorvastatin (LIPITOR) 20 MG tablet Take 1 tablet (20 mg total) by mouth daily at 6 PM. (Patient not taking: Reported on 07/13/2019)  . clopidogrel (PLAVIX) 75 MG tablet Take 1 tablet (75 mg total) by mouth daily with breakfast. (Patient not taking: Reported on 07/13/2019)  . HYDROcodone-acetaminophen (NORCO/VICODIN) 5-325 MG per tablet Take 2 tablets by mouth every 4 (four) hours as needed for moderate pain. (Patient not taking: Reported on 08/23/2019)  . ibuprofen (ADVIL,MOTRIN) 600 MG tablet Take 1 tablet (600 mg total) by mouth every 8 (eight) hours as needed.  08/25/2019 oxyCODONE (OXY IR/ROXICODONE) 5 MG immediate release tablet Take by mouth.   No facility-administered encounter medications on file as of 08/24/2020.    Allergies (verified) Patient has no known allergies.   History: Past Medical History:  Diagnosis Date  . Coronary artery disease    stent x1  . Hypertension    Past Surgical History:  Procedure Laterality Date  . CORONARY ANGIOPLASTY WITH STENT PLACEMENT     Family History  Problem Relation Age of Onset  .  Arthritis Mother   . CAD Father        cabg  . Heart attack Maternal Grandfather   . Stomach cancer Other        uncle  . Heart attack Other        m uncles  . Lung cancer Other        2nd cousin    Social History   Socioeconomic History  . Marital status: Married    Spouse name: Not on file  . Number of children: Not on file  . Years of education: Not on file  . Highest education level: Not on file  Occupational History  . Not on file  Tobacco Use  . Smoking status: Current Every Day Smoker    Packs/day: 1.00  . Smokeless tobacco: Never Used  . Tobacco comment: Smoking cessation discussed 08/23/19  Substance and Sexual Activity  . Alcohol use: Not Currently  . Drug use: No  . Sexual activity: Not on file  Other Topics Concern  . Not on file  Social History Narrative   Legally married wife left him x 5 x in 11/2018    Married 7 x    Kids x 3 daughter 27 y.o lives with him    2 sons (71 y.o)       DPR daughter Louis Carr    Social Determinants of Health   Financial Resource Strain: Low Risk   . Difficulty of Paying Living Expenses: Not hard at all  Food Insecurity:  No Food Insecurity  . Worried About Programme researcher, broadcasting/film/video in the Last Year: Never true  . Ran Out of Food in the Last Year: Never true  Transportation Needs: No Transportation Needs  . Lack of Transportation (Medical): No  . Lack of Transportation (Non-Medical): No  Physical Activity: Not on file  Stress: No Stress Concern Present  . Feeling of Stress : Not at all  Social Connections: Unknown  . Frequency of Communication with Friends and Family: More than three times a week  . Frequency of Social Gatherings with Friends and Family: More than three times a week  . Attends Religious Services: Not on file  . Active Member of Clubs or Organizations: Not on file  . Attends Banker Meetings: Not on file  . Marital Status: Not on file    Tobacco Counseling Ready to quit: Not Answered Counseling  given: Not Answered Comment: Smoking cessation discussed 08/23/19   Clinical Intake:  Pre-visit preparation completed: Yes        Diabetes: No  How often do you need to have someone help you when you read instructions, pamphlets, or other written materials from your doctor or pharmacy?: 1 - Never   Interpreter Needed?: No      Activities of Daily Living In your present state of health, do you have any difficulty performing the following activities: 08/24/2020  Hearing? N  Vision? N  Difficulty concentrating or making decisions? N  Walking or climbing stairs? N  Dressing or bathing? N  Doing errands, shopping? N  Preparing Food and eating ? N  Using the Toilet? N  In the past six months, have you accidently leaked urine? N  Do you have problems with loss of bowel control? N  Managing your Medications? N  Managing your Finances? N  Housekeeping or managing your Housekeeping? N  Some recent data might be hidden    Patient Care Team: McLean-Scocuzza, Pasty Spillers, MD as PCP - General (Internal Medicine)  Indicate any recent Medical Services you may have received from other than [facility] providers in the past year (date may be approximate).     Assessment:   This is a routine wellness examination for Louis Carr.  I connected with Louis Carr today by telephone and verified that I am speaking with the correct person using two identifiers. Location patient: home Location provider: work Persons participating in the virtual visit: patient, Engineer, civil (consulting).    I discussed the limitations, risks, security and privacy concerns of performing an evaluation and management service by telephone and the availability of in person appointments. The patient expressed understanding and verbally consented to this telephonic visit.    Interactive audio and video telecommunications were attempted between this provider and patient, however failed, due to patient having technical difficulties OR patient did not have  access to video capability.  We continued and completed visit with audio only.  Some vital signs may be absent or patient reported.   Hearing/Vision screen  Hearing Screening   125Hz  250Hz  500Hz  1000Hz  2000Hz  3000Hz  4000Hz  6000Hz  8000Hz   Right ear:           Left ear:           Comments: Patient is able to hear conversational tones without difficulty. No issues reported.  Vision Screening Comments: Wears corrective lenses  Visual acuity not assessed, virtual visit. They have seen their ophthalmologist in the last 12 months.   Dietary issues and exercise activities discussed: Current Exercise Habits: Home exercise routine  Healthy diet Good water intake  Goals      Patient Stated   .  I want to quit smoking, I just have to make my mind up. (pt-stated)      Other   .  Follow up with Primary Care Provider      As needed      Depression Screen PHQ 2/9 Scores 08/24/2020 08/23/2019 07/13/2019  PHQ - 2 Score 0 0 0  PHQ- 9 Score - 0 0    Fall Risk Fall Risk  08/24/2020 08/23/2019  Falls in the past year? 0 0  Number falls in past yr: 0 -  Injury with Fall? 0 -  Risk for fall due to : - Impaired balance/gait  Follow up Falls evaluation completed Falls evaluation completed    FALL RISK PREVENTION PERTAINING TO THE HOME: Handrails in use when climbing stairs? Yes Home free of loose throw rugs in walkways, pet beds, electrical cords, etc? Yes  Adequate lighting in your home to reduce risk of falls? Yes   ASSISTIVE DEVICES UTILIZED TO PREVENT FALLS: Life alert? No  Use of a cane, walker or w/c? No   TIMED UP AND GO: Was the test performed? No . Virtual visit.   Cognitive Function: Patient is alert and oriented x3.  Denies difficulty focusing, making decision, memory loss.  Enjoys working on cars. MMSE/6CIT deferred. Normal by direct communication/observation.       6CIT Screen 08/24/2020 08/23/2019  What Year? 0 points 0 points  What month? 0 points 0 points  What time? 0  points 0 points  Count back from 20 0 points 0 points  Months in reverse 0 points 0 points  Repeat phrase - 0 points  Total Score - 0    Immunizations Immunization History  Administered Date(s) Administered  . Td 06/10/2000  . Tdap 03/06/2014, 08/10/2019   Health Maintenance Health Maintenance  Topic Date Due  . COLONOSCOPY (Pts 45-68yrs Insurance coverage will need to be confirmed)  Never done  . TETANUS/TDAP  08/09/2029  . Hepatitis C Screening  Completed  . HPV VACCINES  Aged Out  . INFLUENZA VACCINE  Discontinued  . COVID-19 Vaccine  Discontinued  . PNA vac Low Risk Adult  Discontinued   Colonoscopy- deferred per patient preference.   Vision Screening: Recommended annual ophthalmology exams for early detection of glaucoma and other disorders of the eye. Is the patient up to date with their annual eye exam?  Yes   Dental Screening: Recommended annual dental exams for proper oral hygiene.  Community Resource Referral / Chronic Care Management: CRR required this visit?  No   CCM required this visit?  No      Plan:   Keep all routine maintenance appointments.   Encouraged patient to schedule annual appointment with pcp. Patient plans to call back after looking at his work schedule in the upcoming weeks.   I have personally reviewed and noted the following in the patient's chart:   . Medical and social history . Use of alcohol, tobacco or illicit drugs  . Current medications and supplements . Functional ability and status . Nutritional status . Physical activity . Advanced directives . List of other physicians . Hospitalizations, surgeries, and ER visits in previous 12 months . Vitals . Screenings to include cognitive, depression, and falls . Referrals and appointments  In addition, I have reviewed and discussed with patient certain preventive protocols, quality metrics, and best practice recommendations. A written personalized care plan for preventive  services as well as general preventive health recommendations were provided to patient via mail.     Ashok PallOBrien-Blaney, Denisa L, LPN   [id number]/61/09603/18/2022

## 2021-03-12 ENCOUNTER — Telehealth: Payer: Self-pay | Admitting: Internal Medicine

## 2021-03-12 NOTE — Telephone Encounter (Signed)
Patient received a phone call from [facility] stating it is time to scheduled his colonoscopy. Patient would like to do Cologuard.

## 2021-03-12 NOTE — Telephone Encounter (Signed)
Patient received a phone call from [facility] stating it is time to scheduled his colonoscopy. Patient would like to do Cologuard. Jessah Danser,cma

## 2021-03-13 NOTE — Telephone Encounter (Addendum)
I need additional information prior to approving this.  Has he ever had a colonoscopy before?  If so where was it done?  I do not see any reports in the system?  Has he had any blood in his stool?  Does he have a family history of colon cancer?  It also looks like he is well overdue for follow-up with his PCP.  He should be scheduled for follow-up with her as well.

## 2021-03-14 NOTE — Telephone Encounter (Signed)
I called and the phone had no VM.  Naiomy Watters,cma

## 2021-03-15 NOTE — Telephone Encounter (Signed)
No answer/ no VM.  Elvis Boot,cma  °

## 2021-03-18 NOTE — Telephone Encounter (Signed)
Attempted to contact pt.  Unable to LVM

## 2021-03-22 NOTE — Telephone Encounter (Signed)
I called and spoke with the patient and he stated he wanted the Cologuard before the colonoscopy, Patient stated he has had a colonoscopy with Dr. Alphonsus Sias years ago at [facility].  Patient does not have any blood in his stool, and he does not have a family history of colon cancer.  Please advise if I can order him a cologuard.  Raegyn Renda,cma

## 2021-03-24 NOTE — Telephone Encounter (Signed)
He is scheduled to see Dr McLean-Scocuzza later this month. I would suggest that she discuss this further with him. I will forward this to her to review. We would preferably need to see the prior colonoscopy report prior to ordering the cologuard so that we know that the prior colonoscopy did not have any polyps.

## 2021-04-05 ENCOUNTER — Telehealth: Payer: Self-pay | Admitting: Internal Medicine

## 2021-04-05 ENCOUNTER — Ambulatory Visit: Payer: Medicare HMO | Admitting: Internal Medicine

## 2021-04-05 NOTE — Telephone Encounter (Signed)
Patient no-showed today's appointment; appointment was for 04/05/21 at 3:00, provider notified for review of record.  Letter sent for patient to call in and re-schedule.

## 2021-08-27 ENCOUNTER — Ambulatory Visit: Payer: Medicare HMO

## 2021-08-27 ENCOUNTER — Telehealth: Payer: Self-pay

## 2021-08-27 NOTE — Telephone Encounter (Signed)
Unable to reach patient for scheduled AWV in office. Left message to confirm in office or ok to complete via telephone. No show in office and no answer when called at appointment time. Reschedule as appropriate.  ?

## 2021-08-29 ENCOUNTER — Ambulatory Visit (INDEPENDENT_AMBULATORY_CARE_PROVIDER_SITE_OTHER): Payer: BC Managed Care – PPO

## 2021-08-29 VITALS — Ht 71.0 in | Wt 190.0 lb

## 2021-08-29 DIAGNOSIS — Z0000 Encounter for general adult medical examination without abnormal findings: Secondary | ICD-10-CM | POA: Diagnosis not present

## 2021-08-29 DIAGNOSIS — Z1211 Encounter for screening for malignant neoplasm of colon: Secondary | ICD-10-CM

## 2021-08-29 NOTE — Patient Instructions (Addendum)
?  Louis Carr , ?Thank you for taking time to come for your Medicare Wellness Visit. I appreciate your ongoing commitment to your health goals. Please review the following plan we discussed and let me know if I can assist you in the future.  ? ?These are the goals we discussed: ? Goals   ? ?  Follow up with Primary Care Provider   ?  As needed. ?  ? ?  ?  ?This is a list of the screening recommended for you and due dates:  ?Health Maintenance  ?Topic Date Due  ? Cologuard (Stool DNA test)  Never done  ? Zoster (Shingles) Vaccine (1 of 2) 11/29/2021*  ? Pneumonia Vaccine (1 - PCV) 08/30/2022*  ? Tetanus Vaccine  08/09/2029  ? Hepatitis C Screening: USPSTF Recommendation to screen - Ages 84-79 yo.  Completed  ? HPV Vaccine  Aged Out  ? Flu Shot  Discontinued  ? COVID-19 Vaccine  Discontinued  ?*Topic was postponed. The date shown is not the original due date.  ?  ? ?

## 2021-08-29 NOTE — Progress Notes (Signed)
Subjective:   Louis Carr is a 71 y.o. male who presents for Medicare Annual/Subsequent preventive examination.  Review of Systems    No ROS.  Medicare Wellness Virtual Visit.  Visual/audio telehealth visit, UTA vital signs.   See social history for additional risk factors.   Cardiac Risk Factors include: advanced age (>36men, >33 women);male gender;hypertension     Objective:    Today's Vitals   08/29/21 1118  Weight: 190 lb (86.2 kg)  Height: 5\' 11"  (1.803 m)   Body mass index is 26.5 kg/m.     08/29/2021   11:23 AM 08/24/2020   11:01 AM 08/23/2019   11:45 AM 06/21/2018    9:30 AM  Advanced Directives  Does Patient Have a Medical Advance Directive? No No No No  Would patient like information on creating a medical advance directive? No - Patient declined No - Patient declined No - Patient declined No - Patient declined   Current Medications (verified) Outpatient Encounter Medications as of 08/29/2021  Medication Sig   aspirin EC 81 MG tablet Take 1 tablet (81 mg total) by mouth daily. (Patient not taking: Reported on 08/23/2019)   atorvastatin (LIPITOR) 20 MG tablet Take 1 tablet (20 mg total) by mouth daily at 6 PM. (Patient not taking: Reported on 07/13/2019)   clopidogrel (PLAVIX) 75 MG tablet Take 1 tablet (75 mg total) by mouth daily with breakfast. (Patient not taking: Reported on 07/13/2019)   HYDROcodone-acetaminophen (NORCO/VICODIN) 5-325 MG per tablet Take 2 tablets by mouth every 4 (four) hours as needed for moderate pain. (Patient not taking: Reported on 08/23/2019)   ibuprofen (ADVIL,MOTRIN) 600 MG tablet Take 1 tablet (600 mg total) by mouth every 8 (eight) hours as needed.   oxyCODONE (OXY IR/ROXICODONE) 5 MG immediate release tablet Take by mouth. (Patient not taking: Reported on 08/29/2021)   No facility-administered encounter medications on file as of 08/29/2021.   Allergies (verified) Patient has no known allergies.   History: Past Medical History:   Diagnosis Date   Coronary artery disease    stent x1   Hypertension    Past Surgical History:  Procedure Laterality Date   CORONARY ANGIOPLASTY WITH STENT PLACEMENT     Family History  Problem Relation Age of Onset   Arthritis Mother    CAD Father        cabg   Heart attack Maternal Grandfather    Stomach cancer Other        uncle   Heart attack Other        m uncles   Lung cancer Other        2nd cousin    Social History   Socioeconomic History   Marital status: Married    Spouse name: Not on file   Number of children: Not on file   Years of education: Not on file   Highest education level: Not on file  Occupational History   Not on file  Tobacco Use   Smoking status: Former    Packs/day: 1.00    Types: Cigarettes    Quit date: 05/31/2021    Years since quitting: 0.2   Smokeless tobacco: Never   Tobacco comments:    Smoking cessation discussed 08/23/19  Substance and Sexual Activity   Alcohol use: Not Currently   Drug use: No   Sexual activity: Not on file  Other Topics Concern   Not on file  Social History Narrative   Legally married wife left him x 5 x in  11/2018    Married 7 x    Kids x 3 daughter 40 y.o lives with him    2 sons (87 y.o)       DPR daughter Cassie    Social Determinants of Health   Financial Resource Strain: Low Risk    Difficulty of Paying Living Expenses: Not hard at all  Food Insecurity: No Food Insecurity   Worried About Programme researcher, broadcasting/film/video in the Last Year: Never true   Barista in the Last Year: Never true  Transportation Needs: No Transportation Needs   Lack of Transportation (Medical): No   Lack of Transportation (Non-Medical): No  Physical Activity: Not on file  Stress: No Stress Concern Present   Feeling of Stress : Not at all  Social Connections: Unknown   Frequency of Communication with Friends and Family: More than three times a week   Frequency of Social Gatherings with Friends and Family: More than three  times a week   Attends Religious Services: Not on file   Active Member of Clubs or Organizations: Not on file   Attends Banker Meetings: Not on file   Marital Status: Not on file   Tobacco Counseling Counseling given: Not Answered Tobacco comments: Smoking cessation discussed 08/23/19  Clinical Intake:  Pre-visit preparation completed: Yes        Diabetes: No  How often do you need to have someone help you when you read instructions, pamphlets, or other written materials from your doctor or pharmacy?: 1 - Never Interpreter Needed?: No    Activities of Daily Living    08/29/2021   11:37 AM  In your present state of health, do you have any difficulty performing the following activities:  Hearing? 0  Vision? 0  Difficulty concentrating or making decisions? 0  Walking or climbing stairs? 0  Dressing or bathing? 0  Doing errands, shopping? 0  Preparing Food and eating ? N  Using the Toilet? N  In the past six months, have you accidently leaked urine? N  Do you have problems with loss of bowel control? N  Managing your Medications? N  Managing your Finances? N  Housekeeping or managing your Housekeeping? N   Patient Care Team: McLean-Scocuzza, Pasty Spillers, MD as PCP - General (Internal Medicine)  Indicate any recent Medical Services you may have received from other than [facility] providers in the past year (date may be approximate).     Assessment:   This is a routine wellness examination for [name].  Virtual Visit via Telephone Note I connected with  Kerolos Ponzi Hulbert on 08/29/21 at 11:15 AM EDT by telephone and verified that I am speaking with the correct person using two identifiers.  Persons participating in the virtual visit: patient/Nurse Health Advisor   I discussed the limitations of performing evaluation and management service by telehealth. The patient expressed understanding and agreed to proceed.We continued and completed visit with audio only. Some  vital signs may be absent or patient reported.   Hearing/Vision screen Hearing Screening - Comments:: Patient is able to hear conversational tones without difficulty. No issues reported. Vision Screening - Comments:: Wears corrective lenses  They have seen their ophthalmologist in the last 12 months.   Dietary issues and exercise activities discussed: Current Exercise Habits: Home exercise routine, Type of exercise: walking, Intensity: Mild Regular diet    Goals Addressed               This Visit's Progress  Patient Stated     COMPLETED: I want to quit smoking, I just have to make my mind up. (pt-stated)        Other     Follow up with Primary Care Provider        As needed.       Depression Screen    08/29/2021   11:22 AM 08/24/2020   11:03 AM 08/23/2019   12:00 PM 07/13/2019    1:54 PM  PHQ 2/9 Scores  PHQ - 2 Score 0 0 0 0  PHQ- 9 Score   0 0    Fall Risk    08/29/2021   11:24 AM 08/24/2020   11:03 AM 08/23/2019   11:41 AM  Fall Risk   Falls in the past year? 0 0 0  Number falls in past yr: 0 0   Injury with Fall?  0   Risk for fall due to :   Impaired balance/gait  Follow up Falls evaluation completed Falls evaluation completed Falls evaluation completed    FALL RISK PREVENTION PERTAINING TO THE HOME: Home free of loose throw rugs in walkways, pet beds, electrical cords, etc? Yes  Adequate lighting in your home to reduce risk of falls? Yes   ASSISTIVE DEVICES UTILIZED TO PREVENT FALLS: Life alert? No  Use of a cane, walker or w/c? No   TIMED UP AND GO: Was the test performed? No .   Cognitive Function:  Patient is alert and oriented x3.       08/29/2021   11:38 AM 08/24/2020   11:19 AM 08/23/2019   12:01 PM  6CIT Screen  What Year? 0 points 0 points 0 points  What month? 0 points 0 points 0 points  What time? 0 points 0 points 0 points  Count back from 20 0 points 0 points 0 points  Months in reverse 0 points 0 points 0 points  Repeat  phrase 0 points  0 points  Total Score 0 points  0 points    Immunizations Immunization History  Administered Date(s) Administered   Td 06/10/2000   Tdap 03/06/2014, 08/10/2019   Shingrix Completed?: No.    Education has been provided regarding the importance of this vaccine. Patient has been advised to call insurance company to determine out of pocket expense if they have not yet received this vaccine. Advised may also receive vaccine at local pharmacy or Health Dept. Verbalized acceptance and understanding. Declined.    Pneumococcal vaccine status: Due, Education has been provided regarding the importance of this vaccine. Advised may receive this vaccine at local pharmacy or Health Dept. Aware to provide a copy of the vaccination record if obtained from local pharmacy or Health Dept. Verbalized acceptance and understanding. Declined.   Screening Tests Health Maintenance  Topic Date Due   Fecal DNA (Cologuard)  Never done   Zoster Vaccines- Shingrix (1 of 2) 11/29/2021 (Originally 03/24/2003)   Pneumonia Vaccine 66+ Years old (1 - PCV) 08/30/2022 (Originally 03/24/1957)   TETANUS/TDAP  08/09/2029   Hepatitis C Screening  Completed   HPV VACCINES  Aged Out   INFLUENZA VACCINE  Discontinued   COVID-19 Vaccine  Discontinued   Health Maintenance Health Maintenance Due  Topic Date Due   Fecal DNA (Cologuard)  Never done   Cologuard- ordered per consent.   Lung Cancer Screening: (Low Dose CT Chest recommended if Age 82-80 years, 30 pack-year currently smoking OR have quit w/in 15years.) does not qualify.   Vision Screening: Recommended annual ophthalmology  exams for early detection of glaucoma and other disorders of the eye.  Dental Screening: Recommended annual dental exams for proper oral hygiene  Community Resource Referral / Chronic Care Management: CRR required this visit?  No   CCM required this visit?  No      Plan:   Keep all routine maintenance appointments.   I  have personally reviewed and noted the following in the patient's chart:   Medical and social history Use of alcohol, tobacco or illicit drugs  Current medications and supplements including opioid prescriptions. Patient is not currently taking opioid prescriptions. Functional ability and status Nutritional status Physical activity Advanced directives List of other physicians Hospitalizations, surgeries, and ER visits in previous 12 months Vitals Screenings to include cognitive, depression, and falls Referrals and appointments  In addition, I have reviewed and discussed with patient certain preventive protocols, quality metrics, and best practice recommendations. A written personalized care plan for preventive services as well as general preventive health recommendations were provided to patient.     Ashok Pall, LPN   1/61/0960

## 2021-11-29 ENCOUNTER — Telehealth: Payer: Self-pay

## 2021-11-29 NOTE — Telephone Encounter (Signed)
Was unable to leave vmm because vm has not been set up

## 2022-11-06 ENCOUNTER — Encounter: Payer: Self-pay | Admitting: Nurse Practitioner

## 2022-11-06 ENCOUNTER — Ambulatory Visit (INDEPENDENT_AMBULATORY_CARE_PROVIDER_SITE_OTHER): Payer: BC Managed Care – PPO | Admitting: Nurse Practitioner

## 2022-11-06 VITALS — BP 118/62 | HR 72 | Temp 98.2°F | Ht 71.0 in | Wt 160.0 lb

## 2022-11-06 DIAGNOSIS — L989 Disorder of the skin and subcutaneous tissue, unspecified: Secondary | ICD-10-CM | POA: Diagnosis not present

## 2022-11-06 DIAGNOSIS — R220 Localized swelling, mass and lump, head: Secondary | ICD-10-CM

## 2022-11-06 NOTE — Patient Instructions (Signed)
Referrals sent to dermatology

## 2022-11-06 NOTE — Progress Notes (Signed)
Established Patient Office Visit  Subjective:  Patient ID: Louis Carr, male    DOB: 10-Aug-1950  Age: 72 y.o. MRN: [id number]  CC:  Chief Complaint  Patient presents with   Acute Visit    Lump on forehead that is sore    HPI  Louis Carr presents for lesion on the R side of scalp  since last 1.5 month. He reports the the lesion is growing and is tender to touch.   He has not treated the lesion with any medication.  Patient is not taking any medication at present.  He is overdue  for his chronic care visit.  Last in person visit was in 2021.  Advised patient to make make a follow-up appointment for chronic disease management.  HPI   Past Medical History:  Diagnosis Date   Coronary artery disease    stent x1   Hypertension     Past Surgical History:  Procedure Laterality Date   CORONARY ANGIOPLASTY WITH STENT PLACEMENT      Family History  Problem Relation Age of Onset   Arthritis Mother    CAD Father        cabg   Heart attack Maternal Grandfather    Stomach cancer Other        uncle   Heart attack Other        m uncles   Lung cancer Other        2nd cousin     Social History   Socioeconomic History   Marital status: Married    Spouse name: Not on file   Number of children: Not on file   Years of education: Not on file   Highest education level: Not on file  Occupational History   Not on file  Tobacco Use   Smoking status: Every Day    Packs/day: 1    Types: Cigarettes   Smokeless tobacco: Never   Tobacco comments:    Quit in 2022 and started smoking again in oct, 23 1 pack a day  updated 11/06/22  Substance and Sexual Activity   Alcohol use: Yes    Alcohol/week: 6.0 standard drinks of alcohol    Types: 6 Cans of beer per week   Drug use: No   Sexual activity: Not on file  Other Topics Concern   Not on file  Social History Narrative   Legally married wife left him x 5 x in 11/2018    Married 7 x    Kids x 3 daughter 90 y.o  lives with him    2 sons (62 y.o)       DPR daughter Cassie    Social Determinants of Health   Financial Resource Strain: Low Risk  (08/29/2021)   Overall Financial Resource Strain (CARDIA)    Difficulty of Paying Living Expenses: Not hard at all  Food Insecurity: No Food Insecurity (08/29/2021)   Hunger Vital Sign    Worried About Running Out of Food in the Last Year: Never true    Ran Out of Food in the Last Year: Never true  Transportation Needs: No Transportation Needs (08/29/2021)   PRAPARE - Administrator, Civil Service (Medical): No    Lack of Transportation (Non-Medical): No  Physical Activity: Not on file  Stress: No Stress Concern Present (08/29/2021)   [employer] of Occupational Health - Occupational Stress Questionnaire    Feeling of Stress : Not at all  Social Connections: Unknown (08/29/2021)   Social  Connection and Isolation Panel [NHANES]    Frequency of Communication with Friends and Family: More than three times a week    Frequency of Social Gatherings with Friends and Family: More than three times a week    Attends Religious Services: Not on file    Active Member of Clubs or Organizations: Not on file    Attends Banker Meetings: Not on file    Marital Status: Not on file  Intimate Partner Violence: Not At Risk (08/29/2021)   Humiliation, Afraid, Rape, and Kick questionnaire    Fear of Current or Ex-Partner: No    Emotionally Abused: No    Physically Abused: No    Sexually Abused: No     Outpatient Medications Prior to Visit  Medication Sig Dispense Refill   aspirin EC 81 MG tablet Take 1 tablet (81 mg total) by mouth daily. (Patient not taking: Reported on 08/23/2019) 90 tablet 3   atorvastatin (LIPITOR) 20 MG tablet Take 1 tablet (20 mg total) by mouth daily at 6 PM. (Patient not taking: Reported on 07/13/2019) 90 tablet 3   clopidogrel (PLAVIX) 75 MG tablet Take 1 tablet (75 mg total) by mouth daily with breakfast. (Patient not  taking: Reported on 07/13/2019) 90 tablet 3   HYDROcodone-acetaminophen (NORCO/VICODIN) 5-325 MG per tablet Take 2 tablets by mouth every 4 (four) hours as needed for moderate pain. (Patient not taking: Reported on 08/23/2019) 20 tablet 0   ibuprofen (ADVIL,MOTRIN) 600 MG tablet Take 1 tablet (600 mg total) by mouth every 8 (eight) hours as needed. (Patient not taking: Reported on 11/06/2022) 15 tablet 0   oxyCODONE (OXY IR/ROXICODONE) 5 MG immediate release tablet Take by mouth. (Patient not taking: Reported on 08/29/2021)     No facility-administered medications prior to visit.    No Known Allergies  ROS Review of Systems  Constitutional: Negative.   Respiratory: Negative.    Cardiovascular: Negative.   Skin:  Positive for rash.  Neurological: Negative.   Psychiatric/Behavioral: Negative.        Objective:    Physical Exam Skin:    Findings: Lesion present.     Comments: Crusty, tender to touch less than 1 cm lesion to right scalp.     BP 118/62   Pulse 72   Temp 98.2 F (36.8 C) (Oral)   Ht 5\' 11"  (1.803 m)   Wt 160 lb (72.6 kg)   SpO2 96%   BMI 22.32 kg/m  Wt Readings from Last 3 Encounters:  11/06/22 160 lb (72.6 kg)  08/29/21 190 lb (86.2 kg)  08/24/20 190 lb (86.2 kg)     Health Maintenance  Topic Date Due   Pneumonia Vaccine 33+ Years old (1 of 2 - PCV) Never done   Fecal DNA (Cologuard)  Never done   Zoster Vaccines- Shingrix (1 of 2) Never done   Medicare Annual Wellness (AWV)  08/30/2022   DTaP/Tdap/Td (4 - Td or Tdap) 08/09/2029   Hepatitis C Screening  Completed   HPV VACCINES  Aged Out   INFLUENZA VACCINE  Discontinued   COVID-19 Vaccine  Discontinued    There are no preventive care reminders to display for this patient.  Lab Results  Component Value Date   TSH 0.90 12/13/2018   Lab Results  Component Value Date   WBC 9.8 08/10/2019   HGB 15.2 08/10/2019   HCT 44.6 08/10/2019   MCV 95.3 08/10/2019   PLT 265 08/10/2019   Lab Results   Component Value Date  NA 140 08/10/2019   K 3.8 08/10/2019   CO2 25 08/10/2019   GLUCOSE 170 (H) 08/10/2019   BUN 12 08/10/2019   CREATININE 1.02 08/10/2019   BILITOT 0.7 08/10/2019   ALKPHOS 61 08/10/2019   AST 22 08/10/2019   ALT 21 08/10/2019   PROT 6.9 08/10/2019   ALBUMIN 4.0 08/10/2019   CALCIUM 9.1 08/10/2019   ANIONGAP 13 08/10/2019   GFR 82.38 12/13/2018   Lab Results  Component Value Date   CHOL 193 12/13/2018   Lab Results  Component Value Date   HDL 50.50 12/13/2018   Lab Results  Component Value Date   LDLCALC 128 (H) 12/13/2018   Lab Results  Component Value Date   TRIG 70.0 12/13/2018   Lab Results  Component Value Date   CHOLHDL 4 12/13/2018   No results found for: "HGBA1C"    Assessment & Plan:  Skin lesion of scalp Assessment & Plan: Circular, crusty, tender and less than 1 cm lesion on the right scalp. Will refer to dermatology for further evaluation.  Orders: -     Ambulatory referral to Dermatology    Follow-up: No follow-ups on file.   Kara Dies, NP

## 2022-11-07 ENCOUNTER — Encounter: Payer: Self-pay | Admitting: Nurse Practitioner

## 2022-11-07 DIAGNOSIS — L989 Disorder of the skin and subcutaneous tissue, unspecified: Secondary | ICD-10-CM | POA: Insufficient documentation

## 2022-11-07 NOTE — Assessment & Plan Note (Signed)
Circular, crusty, tender and less than 1 cm lesion on the right scalp. Will refer to dermatology for further evaluation.

## 2022-11-28 ENCOUNTER — Ambulatory Visit (INDEPENDENT_AMBULATORY_CARE_PROVIDER_SITE_OTHER): Payer: BC Managed Care – PPO | Admitting: Nurse Practitioner

## 2022-11-28 ENCOUNTER — Telehealth: Payer: Self-pay | Admitting: *Deleted

## 2022-11-28 ENCOUNTER — Other Ambulatory Visit: Payer: Self-pay | Admitting: Nurse Practitioner

## 2022-11-28 VITALS — BP 122/64 | HR 48 | Temp 97.6°F | Ht 71.0 in | Wt 160.8 lb

## 2022-11-28 DIAGNOSIS — Z716 Tobacco abuse counseling: Secondary | ICD-10-CM | POA: Diagnosis not present

## 2022-11-28 DIAGNOSIS — I2510 Atherosclerotic heart disease of native coronary artery without angina pectoris: Secondary | ICD-10-CM

## 2022-11-28 DIAGNOSIS — H6123 Impacted cerumen, bilateral: Secondary | ICD-10-CM | POA: Diagnosis not present

## 2022-11-28 DIAGNOSIS — E785 Hyperlipidemia, unspecified: Secondary | ICD-10-CM | POA: Diagnosis not present

## 2022-11-28 DIAGNOSIS — Z125 Encounter for screening for malignant neoplasm of prostate: Secondary | ICD-10-CM | POA: Diagnosis not present

## 2022-11-28 DIAGNOSIS — Z122 Encounter for screening for malignant neoplasm of respiratory organs: Secondary | ICD-10-CM

## 2022-11-28 DIAGNOSIS — Z1211 Encounter for screening for malignant neoplasm of colon: Secondary | ICD-10-CM

## 2022-11-28 LAB — CBC WITH DIFFERENTIAL/PLATELET
Basophils Absolute: 0.1 10*3/uL (ref 0.0–0.1)
Basophils Relative: 1 % (ref 0.0–3.0)
Eosinophils Absolute: 0.2 10*3/uL (ref 0.0–0.7)
Eosinophils Relative: 2.6 % (ref 0.0–5.0)
HCT: 43.7 % (ref 39.0–52.0)
Hemoglobin: 14.7 g/dL (ref 13.0–17.0)
Lymphocytes Relative: 26.5 % (ref 12.0–46.0)
Lymphs Abs: 2 10*3/uL (ref 0.7–4.0)
MCHC: 33.6 g/dL (ref 30.0–36.0)
MCV: 94.8 fl (ref 78.0–100.0)
Monocytes Absolute: 0.7 10*3/uL (ref 0.1–1.0)
Monocytes Relative: 9 % (ref 3.0–12.0)
Neutro Abs: 4.6 10*3/uL (ref 1.4–7.7)
Neutrophils Relative %: 60.9 % (ref 43.0–77.0)
Platelets: 249 10*3/uL (ref 150.0–400.0)
RBC: 4.61 Mil/uL (ref 4.22–5.81)
RDW: 13.7 % (ref 11.5–15.5)
WBC: 7.5 10*3/uL (ref 4.0–10.5)

## 2022-11-28 LAB — COMPREHENSIVE METABOLIC PANEL
ALT: 11 U/L (ref 0–53)
AST: 14 U/L (ref 0–37)
Albumin: 3.9 g/dL (ref 3.5–5.2)
Alkaline Phosphatase: 63 U/L (ref 39–117)
BUN: 14 mg/dL (ref 6–23)
CO2: 28 mEq/L (ref 19–32)
Calcium: 8.8 mg/dL (ref 8.4–10.5)
Chloride: 106 mEq/L (ref 96–112)
Creatinine, Ser: 1.08 mg/dL (ref 0.40–1.50)
GFR: 69.93 mL/min (ref >60.00)
Glucose, Bld: 90 mg/dL (ref 70–99)
Potassium: 4 mEq/L (ref 3.5–5.1)
Sodium: 139 mEq/L (ref 135–145)
Total Bilirubin: 0.5 mg/dL (ref 0.2–1.2)
Total Protein: 6.3 g/dL (ref 6.0–8.3)

## 2022-11-28 LAB — TSH: TSH: 0.92 u[IU]/mL (ref 0.35–5.50)

## 2022-11-28 LAB — LIPID PANEL
Cholesterol: 173 mg/dL (ref 0–200)
HDL: 43.7 mg/dL (ref >39.00)
LDL Cholesterol: 119 mg/dL — ABNORMAL HIGH (ref 0–99)
NonHDL: 129.39
Total CHOL/HDL Ratio: 4
Triglycerides: 52 mg/dL (ref 0.0–149.0)
VLDL: 10.4 mg/dL (ref 0.0–40.0)

## 2022-11-28 LAB — PSA: PSA: 2.21 ng/mL (ref 0.10–4.00)

## 2022-11-28 MED ORDER — ROSUVASTATIN CALCIUM 10 MG PO TABS
10.0000 mg | ORAL_TABLET | Freq: Every day | ORAL | 3 refills | Status: DC
Start: 2022-11-28 — End: 2023-06-15

## 2022-11-28 NOTE — Progress Notes (Signed)
Established Patient Office Visit  Subjective:  Patient ID: Louis Carr, male    DOB: 14-Sep-1950  Age: 72 y.o. MRN: [id number]  CC:  Chief Complaint  Patient presents with  . Establish Care    HPI  Louis Carr presents for transfer of care.His previous PCP was Dr. Kennith Center.  He has a history of hyperlipidemia, CAD with 1 stent placement in 2005, hypertension and smoking.  He has been smoking since last 45 years  and did quit in between for 1 year.   He is  not taking any medication at present. He is due for labs today last labs in 2021.  He has not been followed by cardiologist in a while.  Denies any chest pain, shortness of breath, hematuria or hematochezia.  He is overall doing well and no new complaints at present.  HPI   Past Medical History:  Diagnosis Date  . Coronary artery disease    stent x1  . Hypertension     Past Surgical History:  Procedure Laterality Date  . CORONARY ANGIOPLASTY WITH STENT PLACEMENT      Family History  Problem Relation Age of Onset  . Arthritis Mother   . CAD Father        cabg  . Heart attack Maternal Grandfather   . Stomach cancer Other        uncle  . Heart attack Other        m uncles  . Lung cancer Other        2nd cousin     Social History   Socioeconomic History  . Marital status: Married    Spouse name: Not on file  . Number of children: Not on file  . Years of education: Not on file  . Highest education level: Not on file  Occupational History  . Not on file  Tobacco Use  . Smoking status: Every Day    Packs/day: 1    Types: Cigarettes  . Smokeless tobacco: Never  . Tobacco comments:    Quit in 2022 and started smoking again in oct, 23 1 pack a day  updated 11/06/22  Substance and Sexual Activity  . Alcohol use: Yes    Alcohol/week: 6.0 standard drinks of alcohol    Types: 6 Cans of beer per week  . Drug use: No  . Sexual activity: Not on file  Other Topics Concern  . Not on file  Social  History Narrative   Legally married wife left him x 5 x in 11/2018    Married 7 x    Kids x 3 daughter 26 y.o lives with him    2 sons (72 y.o)       DPR daughter Cassie    Social Determinants of Health   Financial Resource Strain: Low Risk  (08/29/2021)   Overall Financial Resource Strain (CARDIA)   . Difficulty of Paying Living Expenses: Not hard at all  Food Insecurity: No Food Insecurity (08/29/2021)   Hunger Vital Sign   . Worried About Programme researcher, broadcasting/film/video in the Last Year: Never true   . Ran Out of Food in the Last Year: Never true  Transportation Needs: No Transportation Needs (08/29/2021)   PRAPARE - Transportation   . Lack of Transportation (Medical): No   . Lack of Transportation (Non-Medical): No  Physical Activity: Not on file  Stress: No Stress Concern Present (08/29/2021)   [employer] of Occupational Health - Occupational Stress Questionnaire   . Feeling  of Stress : Not at all  Social Connections: Unknown (08/29/2021)   Social Connection and Isolation Panel [NHANES]   . Frequency of Communication with Friends and Family: More than three times a week   . Frequency of Social Gatherings with Friends and Family: More than three times a week   . Attends Religious Services: Not on file   . Active Member of Clubs or Organizations: Not on file   . Attends Banker Meetings: Not on file   . Marital Status: Not on file  Intimate Partner Violence: Not At Risk (08/29/2021)   Humiliation, Afraid, Rape, and Kick questionnaire   . Fear of Current or Ex-Partner: No   . Emotionally Abused: No   . Physically Abused: No   . Sexually Abused: No     No outpatient medications prior to visit.   No facility-administered medications prior to visit.    No Known Allergies  ROS Review of Systems Negative unless indicated in HPI.    Objective:    Physical Exam Constitutional:      Appearance: Normal appearance. He is normal weight.  HENT:     Head:  Normocephalic.     Right Ear: There is impacted cerumen.     Left Ear: There is impacted cerumen.     Mouth/Throat:     Mouth: Mucous membranes are moist.     Dentition: Abnormal dentition.     Pharynx: Oropharynx is clear. No oropharyngeal exudate or posterior oropharyngeal erythema.  Eyes:     Extraocular Movements: Extraocular movements intact.     Conjunctiva/sclera: Conjunctivae normal.     Pupils: Pupils are equal, round, and reactive to light.  Neck:     Thyroid: No thyroid mass or thyroid tenderness.  Cardiovascular:     Rate and Rhythm: Normal rate and regular rhythm.     Pulses: Normal pulses.     Heart sounds: Normal heart sounds. No murmur heard. Pulmonary:     Effort: Pulmonary effort is normal.     Breath sounds: Normal breath sounds.  Abdominal:     General: Bowel sounds are normal.     Palpations: Abdomen is soft. There is no mass.     Tenderness: There is no abdominal tenderness. There is no rebound.  Musculoskeletal:        General: No swelling.     Cervical back: Neck supple. No tenderness.     Right lower leg: No edema.     Left lower leg: No edema.  Skin:    Findings: No bruising, erythema or rash.  Neurological:     General: No focal deficit present.     Mental Status: He is alert and oriented to person, place, and time. Mental status is at baseline.  Psychiatric:        Mood and Affect: Mood normal.        Behavior: Behavior normal.        Thought Content: Thought content normal.        Judgment: Judgment normal.    BP 122/64   Pulse (!) 48   Temp 97.6 F (36.4 C)   Ht 5\' 11"  (1.803 m)   Wt 160 lb 12.8 oz (72.9 kg)   SpO2 98%   BMI 22.43 kg/m  Wt Readings from Last 3 Encounters:  11/28/22 160 lb 12.8 oz (72.9 kg)  11/06/22 160 lb (72.6 kg)  08/29/21 190 lb (86.2 kg)     Health Maintenance  Topic Date Due  . Fecal  DNA (Cologuard)  Never done  . Zoster Vaccines- Shingrix (1 of 2) Never done  . Medicare Annual Wellness (AWV)  08/30/2022   . Pneumonia Vaccine 68+ Years old (1 of 2 - PCV) 11/28/2023 (Originally [birth year]24/1959)  . DTaP/Tdap/Td (4 - Td or Tdap) 08/09/2029  . Hepatitis C Screening  Completed  . HPV VACCINES  Aged Out  . INFLUENZA VACCINE  Discontinued  . COVID-19 Vaccine  Discontinued    There are no preventive care reminders to display for this patient.  Lab Results  Component Value Date   TSH 0.90 12/13/2018   Lab Results  Component Value Date   WBC 9.8 08/10/2019   HGB 15.2 08/10/2019   HCT 44.6 08/10/2019   MCV 95.3 08/10/2019   PLT 265 08/10/2019   Lab Results  Component Value Date   NA 140 08/10/2019   K 3.8 08/10/2019   CO2 25 08/10/2019   GLUCOSE 170 (H) 08/10/2019   BUN 12 08/10/2019   CREATININE 1.02 08/10/2019   BILITOT 0.7 08/10/2019   ALKPHOS 61 08/10/2019   AST 22 08/10/2019   ALT 21 08/10/2019   PROT 6.9 08/10/2019   ALBUMIN 4.0 08/10/2019   CALCIUM 9.1 08/10/2019   ANIONGAP 13 08/10/2019   GFR 82.38 12/13/2018   Lab Results  Component Value Date   CHOL 193 12/13/2018   Lab Results  Component Value Date   HDL 50.50 12/13/2018   Lab Results  Component Value Date   LDLCALC 128 (H) 12/13/2018   Lab Results  Component Value Date   TRIG 70.0 12/13/2018   Lab Results  Component Value Date   CHOLHDL 4 12/13/2018   No results found for: "HGBA1C"    Assessment & Plan:  Tobacco abuse counseling  Atherosclerosis of native coronary artery of native heart without angina pectoris    Follow-up: No follow-ups on file.   Kara Dies, NP

## 2022-11-28 NOTE — Telephone Encounter (Signed)
Unable to leave voicemail.

## 2022-11-28 NOTE — Telephone Encounter (Addendum)
-----   Message from Kara Dies, NP sent at 11/28/2022  4:36 PM EDT ----- TSH, liver and kidney function looks good. LDL is elevated. We would like it to be under 70. As discussed during the visit I am sending crestor 10 mg to the pharmacy. Take it daily at bed time.

## 2022-11-28 NOTE — Progress Notes (Signed)
TH, liver and kidney function looks good. LDL is elevated. We would like it to be under 70. As discussed during the visit I am sending crestor 10 mg to the pharmacy. Take it daily at bed time.

## 2022-11-28 NOTE — Patient Instructions (Addendum)
Please go to the lab for blood work. Referral sent for cardiology consult, cancer screening and colonoscopy. Encouraged to purchase Debrox wax removal kit over the counter. Drops (3-4) should be instilled twice daily x3 days, then the ear flushed with warm water on the 4th day.

## 2022-11-28 NOTE — Addendum Note (Signed)
Addended by: Sandy Salaam on: 11/28/2022 08:58 AM   Modules accepted: Orders

## 2022-12-01 ENCOUNTER — Encounter: Payer: Self-pay | Admitting: Nurse Practitioner

## 2022-12-01 DIAGNOSIS — H6123 Impacted cerumen, bilateral: Secondary | ICD-10-CM | POA: Insufficient documentation

## 2022-12-01 DIAGNOSIS — Z716 Tobacco abuse counseling: Secondary | ICD-10-CM | POA: Insufficient documentation

## 2022-12-01 NOTE — Assessment & Plan Note (Signed)
Smoking cessation was discussed, 5-7 minutes was spent of this topic specifically.  Patient stated that he would make his mind and do not need any resources for cessation.

## 2022-12-01 NOTE — Assessment & Plan Note (Signed)
Referral sent to cardiology.

## 2022-12-01 NOTE — Assessment & Plan Note (Signed)
Advised patient to use OTC wax removing drops.

## 2022-12-01 NOTE — Assessment & Plan Note (Signed)
Will check lipid panel. Discussed with patient to start statin therapy for cardiovascular risk reduction.

## 2022-12-04 ENCOUNTER — Encounter: Payer: Self-pay | Admitting: *Deleted

## 2022-12-12 ENCOUNTER — Ambulatory Visit: Payer: BC Managed Care – PPO | Admitting: Nurse Practitioner

## 2022-12-18 ENCOUNTER — Ambulatory Visit: Payer: BC Managed Care – PPO | Admitting: Nurse Practitioner

## 2022-12-18 ENCOUNTER — Encounter: Payer: Self-pay | Admitting: Nurse Practitioner

## 2022-12-18 VITALS — BP 118/76 | HR 76 | Temp 98.0°F | Ht 71.0 in | Wt 160.6 lb

## 2022-12-18 DIAGNOSIS — I2510 Atherosclerotic heart disease of native coronary artery without angina pectoris: Secondary | ICD-10-CM

## 2022-12-18 DIAGNOSIS — Z716 Tobacco abuse counseling: Secondary | ICD-10-CM | POA: Diagnosis not present

## 2022-12-18 DIAGNOSIS — E785 Hyperlipidemia, unspecified: Secondary | ICD-10-CM

## 2022-12-18 NOTE — Patient Instructions (Addendum)
[facility] Address: [address], [place], [place] [address] Hours:  Open ? Closes 5?PM Phone: [phone] Call to schedule colonoscopy.   [facility] [address] [place], [place] [address] [address], [place], [place] [address] +1 [phone] Call to schedule follow up

## 2022-12-18 NOTE — Progress Notes (Signed)
Established Patient Office Visit  Subjective:  Patient ID: Louis Carr, male    DOB: 1950-09-02  Age: 72 y.o. MRN: [id number]  CC:  Chief Complaint  Patient presents with   Medical Management of Chronic Issues    HPI  Louis Carr presents lab review. He has been taking crestor daily and is not having  any side effects.  He is waiting to hear back from the cardiology appointment. He denies chest pain, shortness of breath or abdominal pain  HPI   Past Medical History:  Diagnosis Date   Coronary artery disease    stent x1   Hypertension     Past Surgical History:  Procedure Laterality Date   CORONARY ANGIOPLASTY WITH STENT PLACEMENT      Family History  Problem Relation Age of Onset   Arthritis Mother    CAD Father        cabg   Heart attack Maternal Grandfather    Stomach cancer Other        uncle   Heart attack Other        m uncles   Lung cancer Other        2nd cousin     Social History   Socioeconomic History   Marital status: Married    Spouse name: Not on file   Number of children: Not on file   Years of education: Not on file   Highest education level: Not on file  Occupational History   Not on file  Tobacco Use   Smoking status: Every Day    Current packs/day: 1.00    Types: Cigarettes   Smokeless tobacco: Never   Tobacco comments:    Quit in 2022 and started smoking again in oct, 23 1 pack a day  updated 11/06/22  Substance and Sexual Activity   Alcohol use: Yes    Alcohol/week: 6.0 standard drinks of alcohol    Types: 6 Cans of beer per week   Drug use: No   Sexual activity: Not on file  Other Topics Concern   Not on file  Social History Narrative   Legally married wife left him x 5 x in 11/2018    Married 7 x    Kids x 3 daughter 55 y.o lives with him    2 sons (90 y.o)       DPR daughter Cassie    Social Determinants of Health   Financial Resource Strain: Low Risk  (08/29/2021)   Overall Financial Resource Strain  (CARDIA)    Difficulty of Paying Living Expenses: Not hard at all  Food Insecurity: No Food Insecurity (08/29/2021)   Hunger Vital Sign    Worried About Running Out of Food in the Last Year: Never true    Ran Out of Food in the Last Year: Never true  Transportation Needs: No Transportation Needs (08/29/2021)   PRAPARE - Administrator, Civil Service (Medical): No    Lack of Transportation (Non-Medical): No  Physical Activity: Not on file  Stress: No Stress Concern Present (08/29/2021)   [employer] of Occupational Health - Occupational Stress Questionnaire    Feeling of Stress : Not at all  Social Connections: Unknown (08/29/2021)   Social Connection and Isolation Panel [NHANES]    Frequency of Communication with Friends and Family: More than three times a week    Frequency of Social Gatherings with Friends and Family: More than three times a week    Attends Religious Services:  Not on file    Active Member of Clubs or Organizations: Not on file    Attends Club or Organization Meetings: Not on file    Marital Status: Not on file  Intimate Partner Violence: Not At Risk (08/29/2021)   Humiliation, Afraid, Rape, and Kick questionnaire    Fear of Current or Ex-Partner: No    Emotionally Abused: No    Physically Abused: No    Sexually Abused: No     Outpatient Medications Prior to Visit  Medication Sig Dispense Refill   aspirin 81 MG chewable tablet Chew by mouth daily. Patient takes one 81 mg tablet daily     rosuvastatin (CRESTOR) 10 MG tablet Take 1 tablet (10 mg total) by mouth daily. 90 tablet 3   No facility-administered medications prior to visit.    No Known Allergies  ROS Review of Systems Negative unless indicated in HPI.    Objective:    Physical Exam Constitutional:      Appearance: Normal appearance.  HENT:     Nose: Nose normal.     Mouth/Throat:     Dentition: Abnormal dentition.  Cardiovascular:     Rate and Rhythm: Normal rate and regular  rhythm.     Pulses: Normal pulses.     Heart sounds: Normal heart sounds.  Pulmonary:     Effort: Pulmonary effort is normal.     Breath sounds: Normal breath sounds.  Musculoskeletal:     Cervical back: Normal range of motion.  Neurological:     General: No focal deficit present.     Mental Status: He is alert. Mental status is at baseline.  Psychiatric:        Mood and Affect: Mood normal.        Behavior: Behavior normal.        Thought Content: Thought content normal.        Judgment: Judgment normal.     BP 118/76   Pulse 76   Temp 98 F (36.7 C) (Oral)   Ht 5\' 11"  (1.803 m)   Wt 160 lb 9.6 oz (72.8 kg)   SpO2 98%   BMI 22.40 kg/m  Wt Readings from Last 3 Encounters:  12/18/22 160 lb 9.6 oz (72.8 kg)  11/28/22 160 lb 12.8 oz (72.9 kg)  11/06/22 160 lb (72.6 kg)     Health Maintenance  Topic Date Due   Fecal DNA (Cologuard)  Never done   Zoster Vaccines- Shingrix (1 of 2) Never done   Medicare Annual Wellness (AWV)  08/30/2022   Pneumonia Vaccine 79+ Years old (1 of 2 - PCV) 11/28/2023 (Originally 03/24/1959)   DTaP/Tdap/Td (4 - Td or Tdap) 08/09/2029   Hepatitis C Screening  Completed   HPV VACCINES  Aged Out   INFLUENZA VACCINE  Discontinued   COVID-19 Vaccine  Discontinued    There are no preventive care reminders to display for this patient.  Lab Results  Component Value Date   TSH 0.92 11/28/2022   Lab Results  Component Value Date   WBC 7.5 11/28/2022   HGB 14.7 11/28/2022   HCT 43.7 11/28/2022   MCV 94.8 11/28/2022   PLT 249.0 11/28/2022   Lab Results  Component Value Date   NA 139 11/28/2022   K 4.0 11/28/2022   CO2 28 11/28/2022   GLUCOSE 90 11/28/2022   BUN 14 11/28/2022   CREATININE 1.08 11/28/2022   BILITOT 0.5 11/28/2022   ALKPHOS 63 11/28/2022   AST 14 11/28/2022   ALT  11 11/28/2022   PROT 6.3 11/28/2022   ALBUMIN 3.9 11/28/2022   CALCIUM 8.8 11/28/2022   ANIONGAP 13 08/10/2019   GFR 69.93 11/28/2022   Lab Results   Component Value Date   CHOL 173 11/28/2022   Lab Results  Component Value Date   HDL 43.70 11/28/2022   Lab Results  Component Value Date   LDLCALC 119 (H) 11/28/2022   Lab Results  Component Value Date   TRIG 52.0 11/28/2022   Lab Results  Component Value Date   CHOLHDL 4 11/28/2022   No results found for: "HGBA1C"    Assessment & Plan:  Hyperlipidemia, unspecified hyperlipidemia type Assessment & Plan: Continue statin therapy for cardiovascular risk reduction. Contact details provided to schedule cardiology appointment.   Tobacco abuse counseling Assessment & Plan: Smoking cessation was discussed, 5-7 minutes was spent of this topic specifically.  He states that he will make his mind and will quit by christmas.    Coronary artery disease involving native coronary artery of native heart without angina pectoris Assessment & Plan: Continue rosuvastatin 10 mg. Referral placed for cardiology.     Follow-up: Return in about 6 months (around 06/20/2023) for chronic management.   Kara Dies, NP

## 2023-01-02 NOTE — Assessment & Plan Note (Signed)
Continue rosuvastatin 10 mg. Referral placed for cardiology.

## 2023-01-02 NOTE — Assessment & Plan Note (Signed)
Continue statin therapy for cardiovascular risk reduction. Contact details provided to schedule cardiology appointment.

## 2023-01-02 NOTE — Assessment & Plan Note (Signed)
Smoking cessation was discussed, 5-7 minutes was spent of this topic specifically.  He states that he will make his mind and will quit by christmas.

## 2023-01-27 ENCOUNTER — Other Ambulatory Visit: Payer: Self-pay

## 2023-01-27 DIAGNOSIS — F17210 Nicotine dependence, cigarettes, uncomplicated: Secondary | ICD-10-CM

## 2023-01-27 DIAGNOSIS — Z122 Encounter for screening for malignant neoplasm of respiratory organs: Secondary | ICD-10-CM

## 2023-01-27 DIAGNOSIS — Z87891 Personal history of nicotine dependence: Secondary | ICD-10-CM

## 2023-02-18 ENCOUNTER — Ambulatory Visit: Admission: RE | Admit: 2023-02-18 | Payer: BC Managed Care – PPO | Source: Ambulatory Visit

## 2023-02-18 ENCOUNTER — Ambulatory Visit (INDEPENDENT_AMBULATORY_CARE_PROVIDER_SITE_OTHER): Payer: BC Managed Care – PPO | Admitting: Physician Assistant

## 2023-02-18 DIAGNOSIS — Z91199 Patient's noncompliance with other medical treatment and regimen due to unspecified reason: Secondary | ICD-10-CM

## 2023-02-18 NOTE — Progress Notes (Signed)
I was unable to reach Mr. Panther for his scheduled shared decision making visit.   Darcella Gasman Yaneisy Wenz, PA-C

## 2023-02-20 ENCOUNTER — Ambulatory Visit: Payer: BC Managed Care – PPO

## 2023-02-20 ENCOUNTER — Telehealth: Payer: Self-pay | Admitting: *Deleted

## 2023-02-20 NOTE — Telephone Encounter (Signed)
Patient has been rescheduled for sdmv and ldct

## 2023-02-20 NOTE — Telephone Encounter (Signed)
Scan center told him to call to resched LCS CT. Pls call @ [phone] Thanks.

## 2023-02-24 ENCOUNTER — Encounter: Payer: BC Managed Care – PPO | Admitting: Physician Assistant

## 2023-02-25 ENCOUNTER — Ambulatory Visit: Admission: RE | Admit: 2023-02-25 | Payer: BC Managed Care – PPO | Source: Ambulatory Visit

## 2023-03-06 ENCOUNTER — Ambulatory Visit
Admission: RE | Admit: 2023-03-06 | Discharge: 2023-03-06 | Disposition: A | Discharge status: Home / Self Care | Payer: BC Managed Care – PPO | Source: Ambulatory Visit | Attending: Acute Care | Admitting: Acute Care

## 2023-03-06 ENCOUNTER — Encounter: Payer: Self-pay | Admitting: Adult Health

## 2023-03-06 ENCOUNTER — Ambulatory Visit (INDEPENDENT_AMBULATORY_CARE_PROVIDER_SITE_OTHER): Payer: BC Managed Care – PPO | Admitting: Adult Health

## 2023-03-06 DIAGNOSIS — F17210 Nicotine dependence, cigarettes, uncomplicated: Secondary | ICD-10-CM

## 2023-03-06 DIAGNOSIS — Z122 Encounter for screening for malignant neoplasm of respiratory organs: Secondary | ICD-10-CM | POA: Diagnosis not present

## 2023-03-06 DIAGNOSIS — Z87891 Personal history of nicotine dependence: Secondary | ICD-10-CM

## 2023-03-06 NOTE — Progress Notes (Signed)
  Virtual Visit via Telephone Note  I connected with BEECHER FURIO , 03/06/23 8:55 AM by a telemedicine application and verified that I am speaking with the correct person using two identifiers.  Location: Patient: home Provider: home   I discussed the limitations of evaluation and management by telemedicine and the availability of in person appointments. The patient expressed understanding and agreed to proceed.   Shared Decision Making Visit Lung Cancer Screening Program [phone])   Eligibility: 72 y.o. Pack Years Smoking History Calculation =68 (# packs/per year x # years smoked) 45 yrs ~1.5ppd Recent History of coughing up blood  no Unexplained weight loss? no ( >Than 15 pounds within the last 6 months ) Prior History Lung / other cancer no (Diagnosis within the last 5 years already requiring surveillance chest CT Scans). Smoking Status Current Smoker  Visit Components: Discussion included one or more decision making aids. YES Discussion included risk/benefits of screening. YES Discussion included potential follow up diagnostic testing for abnormal scans. YES Discussion included meaning and risk of over diagnosis. YES Discussion included meaning and risk of False Positives. YES Discussion included meaning of total radiation exposure. YES  Counseling Included: Importance of adherence to annual lung cancer LDCT screening. YES Impact of comorbidities on ability to participate in the program. YES Ability and willingness to under diagnostic treatment. YES  Smoking Cessation Counseling: Current Smokers:  Discussed importance of smoking cessation. yes Information about tobacco cessation classes and interventions provided to patient. yes Patient provided with "ticket" for LDCT Scan. yes Symptomatic Patient. no Diagnosis Code: Tobacco Use Z72.0 Asymptomatic Patient yes  Counseling (Intermediate counseling: > three minutes counseling) U0454 Former Smokers:  Discussed the  importance of maintaining cigarette abstinence. N/a Diagnosis Code: Personal History of Nicotine Dependence. U98.119 Information about tobacco cessation classes and interventions provided to patient. Refused Patient provided with "ticket" for LDCT Scan. yes Written Order for Lung Cancer Screening with LDCT placed in Epic. Yes (CT Chest Lung Cancer Screening Low Dose W/O CM) [id number]  Z12.2-Screening of respiratory organs Z87.891-Personal history of nicotine dependence   Danford Bad 03/06/23

## 2023-03-06 NOTE — Patient Instructions (Signed)
Thank you for participating in the [facility]. It was our pleasure to meet you today. We will call you with the results of your scan within the next few days. Your scan will be assigned a Lung RADS category score by the physicians reading the scans.  This Lung RADS score determines follow up scanning.  See below for description of categories, and follow up screening recommendations. We will be in touch to schedule your follow up screening annually or based on recommendations of our providers. We will fax a copy of your scan results to your Primary Care Physician, or the physician who referred you to the program, to ensure they have the results. Please call the office if you have any questions or concerns regarding your scanning experience or results.  Our office number is [phone]. Please speak with Abigail Miyamoto, RN., Karlton Lemon RN, or Pietro Cassis RN. They are  our Lung Cancer Screening RN.'s If They are unavailable when you call, Please leave a message on the voice mail. We will return your call at our earliest convenience.This voice mail is monitored several times a day.  Remember, if your scan is normal, we will scan you annually as long as you continue to meet the criteria for the program. (Age 42-80, Current smoker or smoker who has quit within the last 15 years). If you are a smoker, remember, quitting is the single most powerful action that you can take to decrease your risk of lung cancer and other pulmonary, breathing related problems. We know quitting is hard, and we are here to help.  Please let us know if there is anything we can do to help you meet your goal of quitting. If you are a former smoker, Counselling psychologist. We are proud of you! Remain smoke free! Remember you can refer friends or family members through the number above.  We will screen them to make sure they meet criteria for the program. Thank you for helping [place] take better care of you  by participating in Lung Screening.   Lung RADS Categories:  Lung RADS 1: no nodules or definitely non-concerning nodules.  Recommendation is for a repeat annual scan in 12 months.  Lung RADS 2:  nodules that are non-concerning in appearance and behavior with a very low likelihood of becoming an active cancer. Recommendation is for a repeat annual scan in 12 months.  Lung RADS 3: nodules that are probably non-concerning , includes nodules with a low likelihood of becoming an active cancer.  Recommendation is for a 55-month repeat screening scan. Often noted after an upper respiratory illness. We will be in touch to make sure you have no questions, and to schedule your 45-month scan.  Lung RADS 4 A: nodules with concerning findings, recommendation is most often for a follow up scan in 3 months or additional testing based on our provider's assessment of the scan. We will be in touch to make sure you have no questions and to schedule the recommended 3 month follow up scan.  Lung RADS 4 B:  indicates findings that are concerning. We will be in touch with you to schedule additional diagnostic testing based on our provider's  assessment of the scan.  You can receive free nicotine replacement therapy ( patches, gum or mints) by calling 1-800-QUIT NOW. Please call so we can get you on the path to becoming  a non-smoker. I know it is hard, but you can do this!  Other options for assistance in  smoking cessation ( As covered by your insurance benefits)  Hypnosis for smoking cessation  [employer] [phone]  Acupuncture for smoking cessation  [employer] [phone]  Steps to Quit Smoking Smoking tobacco is the leading cause of preventable death. It can affect almost every organ in the body. Smoking puts you and people around you at risk for many serious, long-lasting (chronic) diseases. Quitting smoking can be hard, but it is one of the best things that you can do for your  health. It is never too late to quit. Do not give up if you cannot quit the first time. Some people need to try many times to quit. Do your best to stick to your quit plan, and talk with your doctor if you have any questions or concerns. How do I get ready to quit? Pick a date to quit. Set a date within the next 2 weeks to give you time to prepare. Write down the reasons why you are quitting. Keep this list in places where you will see it often. Tell your family, friends, and co-workers that you are quitting. Their support is important. Talk with your doctor about the choices that may help you quit. Find out if your health insurance will pay for these treatments. Know the people, places, things, and activities that make you want to smoke (triggers). Avoid them. What first steps can I take to quit smoking? Throw away all cigarettes at home, at work, and in your car. Throw away the things that you use when you smoke, such as ashtrays and lighters. Clean your car. Empty the ashtray. Clean your home, including curtains and carpets. What can I do to help me quit smoking? Talk with your doctor about taking medicines and seeing a counselor. You are more likely to succeed when you do both. If you are pregnant or breastfeeding: Talk with your doctor about counseling or other ways to quit smoking. Do not take medicine to help you quit smoking unless your doctor tells you to. Quit right away Quit smoking completely, instead of slowly cutting back on how much you smoke over a period of time. Stopping smoking right away may be more successful than slowly quitting. Go to counseling. In-person is best if this is an option. You are more likely to quit if you go to counseling sessions regularly. Take medicine You may take medicines to help you quit. Some medicines need a prescription, and some you can buy over-the-counter. Some medicines may contain a drug called nicotine to replace the nicotine in cigarettes.  Medicines may: Help you stop having the desire to smoke (cravings). Help to stop the problems that come when you stop smoking (withdrawal symptoms). Your doctor may ask you to use: Nicotine patches, gum, or lozenges. Nicotine inhalers or sprays. Non-nicotine medicine that you take by mouth. Find resources Find resources and other ways to help you quit smoking and remain smoke-free after you quit. They include: Online chats with a Veterinary surgeon. Phone quitlines. Printed Materials engineer. Support groups or group counseling. Text messaging programs. Mobile phone apps. Use apps on your mobile phone or tablet that can help you stick to your quit plan. Examples of free services include Quit Guide from the CDC and smokefree.gov  What can I do to make it easier to quit?  Talk to your family and friends. Ask them to support and encourage you. Call a phone quitline, such as 1-800-QUIT-NOW, reach out to support groups, or work with a Veterinary surgeon. Ask people  who smoke to not smoke around you. Avoid places that make you want to smoke, such as: Bars. Parties. Smoke-break areas at work. Spend time with people who do not smoke. Lower the stress in your life. Stress can make you want to smoke. Try these things to lower stress: Getting regular exercise. Doing deep-breathing exercises. Doing yoga. Meditating. What benefits will I see if I quit smoking? Over time, you may have: A better sense of smell and taste. Less coughing and sore throat. A slower heart rate. Lower blood pressure. Clearer skin. Better breathing. Fewer sick days. Summary Quitting smoking can be hard, but it is one of the best things that you can do for your health. Do not give up if you cannot quit the first time. Some people need to try many times to quit. When you decide to quit smoking, make a plan to help you succeed. Quit smoking right away, not slowly over a period of time. When you start quitting, get help and support  to keep you smoke-free. This information is not intended to replace advice given to you by your health care provider. Make sure you discuss any questions you have with your health care provider. Document Revised: 05/17/2021 Document Reviewed: 05/17/2021 Elsevier Patient Education  2024 [employer].

## 2023-03-16 ENCOUNTER — Telehealth: Payer: Self-pay

## 2023-03-16 NOTE — Patient Outreach (Signed)
  Care Coordination   Initial Visit Note   03/16/2023 Name: RODOLPH HAGEMANN MRN: [id number] DOB: Jul 17, 1950  Driscilla Moats Yoak is a 72 y.o. year old male who sees Kara Dies, NP for primary care. I spoke with  Driscilla Moats Doolan by phone today.  What matters to the patients health and wellness today?  Patient denies having nursing and/ or community resource needs.   Patient states he doesn't feel he needs these services at this time.     Goals Addressed             This Visit's Progress    COMPLETED: Care coordination activities - no follow up needed       Interventions Today    Flowsheet Row Most Recent Value  General Interventions   General Interventions Discussed/Reviewed General Interventions Discussed, Doctor Visits, Vaccines  [Care coordination services discussed. SDOH survey completed. AWV discussed and patient advised to contact provider office to schedule. Discussed vaccines. Advised to contact PCP office if care coordination services needed in the future.]  Doctor Visits Discussed/Reviewed Doctor Visits Reviewed              SDOH assessments and interventions completed:  Yes  SDOH Interventions Today    Flowsheet Row Most Recent Value  SDOH Interventions   Food Insecurity Interventions Intervention Not Indicated  Housing Interventions Intervention Not Indicated  Transportation Interventions Intervention Not Indicated        Care Coordination Interventions:  Yes, provided   Follow up plan: No further intervention required.   Encounter Outcome:  Patient Visit Completed   [facility] Care Coordination [phone] direct line

## 2023-03-20 ENCOUNTER — Other Ambulatory Visit: Payer: Self-pay | Admitting: Acute Care

## 2023-03-20 DIAGNOSIS — Z122 Encounter for screening for malignant neoplasm of respiratory organs: Secondary | ICD-10-CM

## 2023-03-20 DIAGNOSIS — F17210 Nicotine dependence, cigarettes, uncomplicated: Secondary | ICD-10-CM

## 2023-03-20 DIAGNOSIS — Z87891 Personal history of nicotine dependence: Secondary | ICD-10-CM

## 2023-06-15 ENCOUNTER — Other Ambulatory Visit: Payer: Self-pay | Admitting: Nurse Practitioner

## 2023-06-23 ENCOUNTER — Encounter: Payer: Self-pay | Admitting: *Deleted

## 2023-06-25 ENCOUNTER — Ambulatory Visit: Payer: BC Managed Care – PPO | Admitting: Nurse Practitioner

## 2023-07-01 DIAGNOSIS — H2513 Age-related nuclear cataract, bilateral: Secondary | ICD-10-CM | POA: Diagnosis not present

## 2023-07-02 ENCOUNTER — Ambulatory Visit: Payer: BC Managed Care – PPO | Admitting: Nurse Practitioner

## 2023-08-10 DIAGNOSIS — I2510 Atherosclerotic heart disease of native coronary artery without angina pectoris: Secondary | ICD-10-CM | POA: Diagnosis not present

## 2023-12-02 ENCOUNTER — Ambulatory Visit: Payer: BC Managed Care – PPO | Admitting: Dermatology

## 2023-12-08 ENCOUNTER — Ambulatory Visit: Admitting: Dermatology

## 2023-12-08 ENCOUNTER — Encounter: Payer: Self-pay | Admitting: Dermatology

## 2023-12-08 DIAGNOSIS — L820 Inflamed seborrheic keratosis: Secondary | ICD-10-CM | POA: Diagnosis not present

## 2023-12-08 DIAGNOSIS — L988 Other specified disorders of the skin and subcutaneous tissue: Secondary | ICD-10-CM

## 2023-12-08 DIAGNOSIS — D225 Melanocytic nevi of trunk: Secondary | ICD-10-CM

## 2023-12-08 DIAGNOSIS — L570 Actinic keratosis: Secondary | ICD-10-CM | POA: Diagnosis not present

## 2023-12-08 DIAGNOSIS — L814 Other melanin hyperpigmentation: Secondary | ICD-10-CM | POA: Diagnosis not present

## 2023-12-08 DIAGNOSIS — Z1283 Encounter for screening for malignant neoplasm of skin: Secondary | ICD-10-CM | POA: Diagnosis not present

## 2023-12-08 DIAGNOSIS — Z7189 Other specified counseling: Secondary | ICD-10-CM

## 2023-12-08 DIAGNOSIS — W908XXA Exposure to other nonionizing radiation, initial encounter: Secondary | ICD-10-CM

## 2023-12-08 DIAGNOSIS — L578 Other skin changes due to chronic exposure to nonionizing radiation: Secondary | ICD-10-CM | POA: Diagnosis not present

## 2023-12-08 DIAGNOSIS — D229 Melanocytic nevi, unspecified: Secondary | ICD-10-CM

## 2023-12-08 DIAGNOSIS — L821 Other seborrheic keratosis: Secondary | ICD-10-CM

## 2023-12-08 NOTE — Progress Notes (Signed)
 New Patient Visit   Subjective  Louis Carr is a 73 y.o. male who presents for the following: Skin Cancer Screening and Upper Body Skin Exam  The patient presents for Upper Body Skin Exam (UBSE) for skin cancer screening and mole check. The patient has spots, moles and lesions to be evaluated, some may be new or changing and the patient may have concern these could be cancer.  Girlfriend is with patient and contributes to history.   The following portions of the chart were reviewed this encounter and updated as appropriate: medications, allergies, medical history  Review of Systems:  No other skin or systemic complaints except as noted in HPI or Assessment and Plan.  Objective  Well appearing patient in no apparent distress; mood and affect are within normal limits.  All skin waist up examined. Relevant physical exam findings are noted in the Assessment and Plan.  left scapular x 1, left mid to low back x 1, left of midline scalp x 1 (3) Stuck-on, waxy, tan-brown papules and plaques -- Discussed benign etiology and prognosis.  left nose x 2, forehead x 15 (17) Erythematous thin papules/macules with gritty scale.   Assessment & Plan   INFLAMED SEBORRHEIC KERATOSIS (3) left scapular x 1, left mid to low back x 1, left of midline scalp x 1 (3) Symptomatic, irritating, patient would like treated.  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  - Recommend staying in the shade or wearing long sleeves, sun glasses (UVA+UVB protection) and wide brim hats (4-inch brim around the entire circumference of the hat). - Call for new or changing lesions.  Destruction of lesion - left nose x 2, forehead x 15 (17) Complexity: simple   Destruction method: cryotherapy   Informed consent: discussed and consent obtained   Timeout:  patient name, date of birth, surgical site, and procedure verified Lesion destroyed using liquid nitrogen: Yes   Region frozen until ice ball extended beyond lesion: Yes   Outcome: patient tolerated procedure well with no complications   Post-procedure details: wound care instructions given   ACTINIC SKIN DAMAGE   SKIN CANCER SCREENING   LENTIGO   MELANOCYTIC NEVUS, UNSPECIFIED LOCATION   COUNSELING AND COORDINATION OF CARE   FAVRE AND RACOUCHOT SYNDROME    Skin cancer screening performed today.  Lentigines, Seborrheic Keratoses, Hemangiomas - Benign normal skin lesions - Benign-appearing - Call for any changes   Nevus  vs Neurofibroma  Left posterior lateral waistline  -  pink-flesh-colored symmetric papule - Benign appearing on exam today - Observation - Call clinic for new or changing moles - Recommend daily use of broad spectrum spf 30+ sunscreen to sun-exposed areas.   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  May initiate at follow up. Benign-appearing.  Observation.  Call clinic for new or changing lesions.  Recommend daily use of broad spectrum spf 30+ sunscreen to sun-exposed areas.    Return in about 8 months (around 08/07/2024) for Aks, ISKs.  IFay Kirks, CMA,  am acting as scribe for Alm Rhyme, MD .   Documentation: I have reviewed the above documentation for accuracy and completeness, and I agree with the above.  Alm Rhyme, MD

## 2023-12-08 NOTE — Patient Instructions (Addendum)

## 2024-03-16 ENCOUNTER — Other Ambulatory Visit: Payer: Self-pay | Admitting: Acute Care

## 2024-03-16 DIAGNOSIS — F17210 Nicotine dependence, cigarettes, uncomplicated: Secondary | ICD-10-CM

## 2024-03-16 DIAGNOSIS — Z87891 Personal history of nicotine dependence: Secondary | ICD-10-CM

## 2024-03-16 DIAGNOSIS — Z122 Encounter for screening for malignant neoplasm of respiratory organs: Secondary | ICD-10-CM

## 2024-03-17 ENCOUNTER — Ambulatory Visit: Admission: RE | Admit: 2024-03-17 | Source: Ambulatory Visit

## 2024-03-28 ENCOUNTER — Ambulatory Visit
Admission: RE | Admit: 2024-03-28 | Discharge: 2024-03-28 | Disposition: A | Discharge status: Home / Self Care | Source: Ambulatory Visit | Attending: Acute Care | Admitting: Acute Care

## 2024-03-28 DIAGNOSIS — Z87891 Personal history of nicotine dependence: Secondary | ICD-10-CM | POA: Diagnosis present

## 2024-03-28 DIAGNOSIS — F17210 Nicotine dependence, cigarettes, uncomplicated: Secondary | ICD-10-CM | POA: Diagnosis present

## 2024-03-28 DIAGNOSIS — Z122 Encounter for screening for malignant neoplasm of respiratory organs: Secondary | ICD-10-CM | POA: Diagnosis present

## 2024-03-31 ENCOUNTER — Other Ambulatory Visit: Payer: Self-pay

## 2024-03-31 DIAGNOSIS — F17210 Nicotine dependence, cigarettes, uncomplicated: Secondary | ICD-10-CM

## 2024-03-31 DIAGNOSIS — Z122 Encounter for screening for malignant neoplasm of respiratory organs: Secondary | ICD-10-CM

## 2024-03-31 DIAGNOSIS — Z87891 Personal history of nicotine dependence: Secondary | ICD-10-CM

## 2024-08-09 ENCOUNTER — Ambulatory Visit: Admitting: Dermatology
